# Patient Record
Sex: Female | Born: 1990 | Race: White | Hispanic: No | Marital: Married | Smoking: Never smoker
Health system: Southern US, Community
[De-identification: ages and names within clinical notes are randomized; demographics above are authoritative.]

## PROBLEM LIST (undated history)

## (undated) DIAGNOSIS — F909 Attention-deficit hyperactivity disorder, unspecified type: Secondary | ICD-10-CM

---
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
## 2011-09-24 LAB — DIFFERENTIAL
Monocytes Absolute: 0.9
Neutro Abs: 10.7 — ABNORMAL HIGH

## 2011-09-24 LAB — CBC
Hemoglobin: 14.4
MCV: 87.1
RDW: 12

## 2011-09-24 LAB — BASIC METABOLIC PANEL
BUN: 9
Chloride: 102
Creatinine, Ser: 0.6
Sodium: 141

## 2013-11-17 ENCOUNTER — Ambulatory Visit (HOSPITAL_BASED_OUTPATIENT_CLINIC_OR_DEPARTMENT_OTHER)
Admission: EM | Admit: 2013-11-17 | Discharge: 2013-11-18 | Disposition: A | Discharge status: Home / Self Care | Payer: BC Managed Care – PPO | Attending: General Surgery | Admitting: General Surgery

## 2013-11-17 ENCOUNTER — Observation Stay (HOSPITAL_COMMUNITY): Payer: BC Managed Care – PPO | Admitting: Anesthesiology

## 2013-11-17 ENCOUNTER — Encounter (HOSPITAL_COMMUNITY): Payer: BC Managed Care – PPO | Admitting: Anesthesiology

## 2013-11-17 ENCOUNTER — Emergency Department (HOSPITAL_BASED_OUTPATIENT_CLINIC_OR_DEPARTMENT_OTHER): Payer: BC Managed Care – PPO

## 2013-11-17 ENCOUNTER — Encounter (HOSPITAL_COMMUNITY)
Admission: EM | Disposition: A | Discharge status: Home / Self Care | Payer: Self-pay | Source: Home / Self Care | Attending: Emergency Medicine

## 2013-11-17 ENCOUNTER — Encounter (HOSPITAL_BASED_OUTPATIENT_CLINIC_OR_DEPARTMENT_OTHER): Payer: Self-pay | Admitting: Emergency Medicine

## 2013-11-17 DIAGNOSIS — K3580 Unspecified acute appendicitis: Secondary | ICD-10-CM | POA: Diagnosis present

## 2013-11-17 HISTORY — PX: LAPAROSCOPIC APPENDECTOMY: SHX408

## 2013-11-17 HISTORY — DX: Attention-deficit hyperactivity disorder, unspecified type: F90.9

## 2013-11-17 LAB — RPR: RPR Ser Ql: NONREACTIVE

## 2013-11-17 LAB — CBC WITH DIFFERENTIAL/PLATELET
Basophils Absolute: 0 10*3/uL (ref 0.0–0.1)
Basophils Relative: 0 % (ref 0–1)
Eosinophils Absolute: 0.2 10*3/uL (ref 0.0–0.7)
Eosinophils Relative: 2 % (ref 0–5)
HCT: 42.6 % (ref 36.0–46.0)
Lymphocytes Relative: 28 % (ref 12–46)
Lymphs Abs: 3.4 10*3/uL (ref 0.7–4.0)
MCH: 29.8 pg (ref 26.0–34.0)
Monocytes Relative: 9 % (ref 3–12)
Neutrophils Relative %: 61 % (ref 43–77)
Platelets: 290 10*3/uL (ref 150–400)
RDW: 12.2 % (ref 11.5–15.5)
WBC: 12.3 10*3/uL — ABNORMAL HIGH (ref 4.0–10.5)

## 2013-11-17 LAB — URINALYSIS, ROUTINE W REFLEX MICROSCOPIC
Bilirubin Urine: NEGATIVE
Ketones, ur: NEGATIVE mg/dL
Protein, ur: NEGATIVE mg/dL
Urobilinogen, UA: 0.2 mg/dL (ref 0.0–1.0)

## 2013-11-17 LAB — URINE MICROSCOPIC-ADD ON

## 2013-11-17 LAB — CBC
HCT: 36.4 % (ref 36.0–46.0)
MCH: 29.9 pg (ref 26.0–34.0)
MCHC: 34.6 g/dL (ref 30.0–36.0)
Platelets: 245 10*3/uL (ref 150–400)
RDW: 12.3 % (ref 11.5–15.5)

## 2013-11-17 LAB — COMPREHENSIVE METABOLIC PANEL
CO2: 24 mEq/L (ref 19–32)
Chloride: 102 mEq/L (ref 96–112)
GFR calc Af Amer: >90 mL/min (ref >90)
GFR calc non Af Amer: >90 mL/min (ref >90)
Total Bilirubin: 0.2 mg/dL — ABNORMAL LOW (ref 0.3–1.2)
Total Protein: 7.6 g/dL (ref 6.0–8.3)

## 2013-11-17 LAB — LIPASE, BLOOD: Lipase: 34 U/L (ref 11–59)

## 2013-11-17 LAB — CREATININE, SERUM
Creatinine, Ser: 0.67 mg/dL (ref 0.50–1.10)
GFR calc Af Amer: >90 mL/min (ref >90)
GFR calc non Af Amer: >90 mL/min (ref >90)

## 2013-11-17 LAB — PREGNANCY, URINE: Preg Test, Ur: NEGATIVE

## 2013-11-17 SURGERY — APPENDECTOMY, LAPAROSCOPIC
Anesthesia: General | Site: Abdomen | Wound class: Contaminated

## 2013-11-17 MED ORDER — PROPOFOL 10 MG/ML IV BOLUS
INTRAVENOUS | Status: AC
  Filled 2013-11-17: qty 20

## 2013-11-17 MED ORDER — FENTANYL CITRATE 0.05 MG/ML IJ SOLN
INTRAMUSCULAR | Status: AC
  Filled 2013-11-17: qty 2

## 2013-11-17 MED ORDER — NEOSTIGMINE METHYLSULFATE 1 MG/ML IJ SOLN
INTRAMUSCULAR | Status: AC
  Filled 2013-11-17: qty 10

## 2013-11-17 MED ORDER — SODIUM CHLORIDE 0.9 % IV SOLN
1000.0000 mL | INTRAVENOUS | Status: DC
  Administered 2013-11-17: 1000 mL via INTRAVENOUS

## 2013-11-17 MED ORDER — MIDAZOLAM HCL 2 MG/2ML IJ SOLN
INTRAMUSCULAR | Status: AC
  Filled 2013-11-17: qty 2

## 2013-11-17 MED ORDER — PROPOFOL 10 MG/ML IV BOLUS
INTRAVENOUS | Status: DC | PRN
  Administered 2013-11-17: 110 mg via INTRAVENOUS

## 2013-11-17 MED ORDER — ONDANSETRON HCL 4 MG PO TABS: 4.0000 mg | ORAL_TABLET | Freq: Four times a day (QID) | ORAL | Status: DC | PRN

## 2013-11-17 MED ORDER — SUCCINYLCHOLINE CHLORIDE 20 MG/ML IJ SOLN
INTRAMUSCULAR | Status: DC | PRN
  Administered 2013-11-17: 60 mg via INTRAVENOUS

## 2013-11-17 MED ORDER — FENTANYL CITRATE 0.05 MG/ML IJ SOLN
25.0000 ug | INTRAMUSCULAR | Status: DC | PRN
  Administered 2013-11-17 (×3): 50 ug via INTRAVENOUS

## 2013-11-17 MED ORDER — ENOXAPARIN SODIUM 40 MG/0.4ML ~~LOC~~ SOLN
40.0000 mg | SUBCUTANEOUS | Status: DC
  Administered 2013-11-18: 40 mg via SUBCUTANEOUS
  Filled 2013-11-17: qty 0.4

## 2013-11-17 MED ORDER — DEXAMETHASONE SODIUM PHOSPHATE 10 MG/ML IJ SOLN
INTRAMUSCULAR | Status: DC | PRN
  Administered 2013-11-17: 6 mg via INTRAVENOUS

## 2013-11-17 MED ORDER — 0.9 % SODIUM CHLORIDE (POUR BTL) OPTIME
TOPICAL | Status: DC | PRN
  Administered 2013-11-17: 1000 mL

## 2013-11-17 MED ORDER — ONDANSETRON HCL 4 MG/2ML IJ SOLN: 4.0000 mg | Freq: Four times a day (QID) | INTRAMUSCULAR | Status: DC | PRN

## 2013-11-17 MED ORDER — LIDOCAINE HCL (CARDIAC) 20 MG/ML IV SOLN
INTRAVENOUS | Status: AC
  Filled 2013-11-17: qty 5

## 2013-11-17 MED ORDER — GLYCOPYRROLATE 0.2 MG/ML IJ SOLN
INTRAMUSCULAR | Status: DC | PRN
  Administered 2013-11-17: .4 mg via INTRAVENOUS

## 2013-11-17 MED ORDER — SUCCINYLCHOLINE CHLORIDE 20 MG/ML IJ SOLN
INTRAMUSCULAR | Status: AC
  Filled 2013-11-17: qty 1

## 2013-11-17 MED ORDER — CISATRACURIUM BESYLATE 20 MG/10ML IV SOLN
INTRAVENOUS | Status: AC
  Filled 2013-11-17: qty 10

## 2013-11-17 MED ORDER — ONDANSETRON HCL 4 MG/2ML IJ SOLN
4.0000 mg | Freq: Once | INTRAMUSCULAR | Status: AC
  Administered 2013-11-17: 4 mg via INTRAVENOUS
  Filled 2013-11-17: qty 2

## 2013-11-17 MED ORDER — FENTANYL CITRATE 0.05 MG/ML IJ SOLN
12.5000 ug | INTRAMUSCULAR | Status: DC | PRN
  Administered 2013-11-17: 12.5 ug via INTRAVENOUS
  Filled 2013-11-17: qty 2

## 2013-11-17 MED ORDER — ENOXAPARIN SODIUM 40 MG/0.4ML ~~LOC~~ SOLN
40.0000 mg | SUBCUTANEOUS | Status: DC
  Filled 2013-11-17: qty 0.4

## 2013-11-17 MED ORDER — LACTATED RINGERS IV SOLN
INTRAVENOUS | Status: DC | PRN
  Administered 2013-11-17: 12:00:00 via INTRAVENOUS

## 2013-11-17 MED ORDER — DEXTROSE 5 % IV SOLN
1.0000 g | INTRAVENOUS | Status: DC
  Filled 2013-11-17: qty 1

## 2013-11-17 MED ORDER — FENTANYL CITRATE 0.05 MG/ML IJ SOLN
INTRAMUSCULAR | Status: DC | PRN
  Administered 2013-11-17: 100 ug via INTRAVENOUS
  Administered 2013-11-17: 50 ug via INTRAVENOUS
  Administered 2013-11-17: 100 ug via INTRAVENOUS

## 2013-11-17 MED ORDER — FENTANYL CITRATE 0.05 MG/ML IJ SOLN
INTRAMUSCULAR | Status: AC
  Filled 2013-11-17: qty 5

## 2013-11-17 MED ORDER — FENTANYL CITRATE 0.05 MG/ML IJ SOLN
25.0000 ug | INTRAMUSCULAR | Status: DC | PRN
  Administered 2013-11-17 – 2013-11-18 (×3): 25 ug via INTRAVENOUS
  Filled 2013-11-17 (×3): qty 2

## 2013-11-17 MED ORDER — CISATRACURIUM BESYLATE (PF) 10 MG/5ML IV SOLN
INTRAVENOUS | Status: DC | PRN
  Administered 2013-11-17: 4 mg via INTRAVENOUS

## 2013-11-17 MED ORDER — PIPERACILLIN-TAZOBACTAM 3.375 G IVPB
INTRAVENOUS | Status: AC
  Filled 2013-11-17: qty 50

## 2013-11-17 MED ORDER — MENTHOL 3 MG MT LOZG
1.0000 | LOZENGE | OROMUCOSAL | Status: DC | PRN
  Filled 2013-11-17: qty 9

## 2013-11-17 MED ORDER — SODIUM CHLORIDE 0.9 % IV SOLN
INTRAVENOUS | Status: DC
  Administered 2013-11-17: 10:00:00 via INTRAVENOUS

## 2013-11-17 MED ORDER — LIDOCAINE HCL (CARDIAC) 20 MG/ML IV SOLN
INTRAVENOUS | Status: DC | PRN
  Administered 2013-11-17: 60 mg via INTRAVENOUS

## 2013-11-17 MED ORDER — CEFOTETAN DISODIUM-DEXTROSE 1-3.58 GM-% IV SOLR
INTRAVENOUS | Status: AC
  Filled 2013-11-17: qty 50

## 2013-11-17 MED ORDER — NEOSTIGMINE METHYLSULFATE 1 MG/ML IJ SOLN
INTRAMUSCULAR | Status: DC | PRN
  Administered 2013-11-17: 3 mg via INTRAVENOUS

## 2013-11-17 MED ORDER — POTASSIUM CHLORIDE IN NACL 20-0.9 MEQ/L-% IV SOLN
INTRAVENOUS | Status: DC
  Administered 2013-11-17: 18:00:00 via INTRAVENOUS
  Filled 2013-11-17 (×3): qty 1000

## 2013-11-17 MED ORDER — ONDANSETRON HCL 4 MG/2ML IJ SOLN
INTRAMUSCULAR | Status: DC | PRN
  Administered 2013-11-17: 4 mg via INTRAVENOUS

## 2013-11-17 MED ORDER — HYDROMORPHONE HCL PF 1 MG/ML IJ SOLN
0.5000 mg | INTRAMUSCULAR | Status: AC | PRN
  Administered 2013-11-17 (×3): 0.5 mg via INTRAVENOUS
  Filled 2013-11-17 (×2): qty 1

## 2013-11-17 MED ORDER — LACTATED RINGERS IV SOLN: INTRAVENOUS | Status: DC

## 2013-11-17 MED ORDER — GLYCOPYRROLATE 0.2 MG/ML IJ SOLN
INTRAMUSCULAR | Status: AC
  Filled 2013-11-17: qty 2

## 2013-11-17 MED ORDER — ONDANSETRON HCL 4 MG/2ML IJ SOLN
INTRAMUSCULAR | Status: AC
  Filled 2013-11-17: qty 2

## 2013-11-17 MED ORDER — BUPIVACAINE-EPINEPHRINE PF 0.5-1:200000 % IJ SOLN
INTRAMUSCULAR | Status: DC | PRN
  Administered 2013-11-17: 15 mL

## 2013-11-17 MED ORDER — IOHEXOL 300 MG/ML  SOLN
50.0000 mL | Freq: Once | INTRAMUSCULAR | Status: AC | PRN
  Administered 2013-11-17: 50 mL via ORAL

## 2013-11-17 MED ORDER — SODIUM CHLORIDE 0.9 % IV SOLN
1000.0000 mL | Freq: Once | INTRAVENOUS | Status: AC
  Administered 2013-11-17: 1000 mL via INTRAVENOUS

## 2013-11-17 MED ORDER — IOHEXOL 300 MG/ML  SOLN
100.0000 mL | Freq: Once | INTRAMUSCULAR | Status: AC | PRN
  Administered 2013-11-17: 100 mL via INTRAVENOUS

## 2013-11-17 MED ORDER — OXYCODONE-ACETAMINOPHEN 5-325 MG PO TABS
1.0000 | ORAL_TABLET | ORAL | Status: DC | PRN
  Administered 2013-11-18 (×2): 1 via ORAL
  Filled 2013-11-17 (×2): qty 1

## 2013-11-17 MED ORDER — LACTATED RINGERS IR SOLN
Status: DC | PRN
  Administered 2013-11-17: 1000 mL

## 2013-11-17 MED ORDER — BUPIVACAINE-EPINEPHRINE PF 0.5-1:200000 % IJ SOLN
INTRAMUSCULAR | Status: AC
  Filled 2013-11-17: qty 30

## 2013-11-17 MED ORDER — DEXTROSE 5 % IV SOLN: 1.0000 g | Freq: Two times a day (BID) | INTRAVENOUS | Status: DC

## 2013-11-17 MED ORDER — MIDAZOLAM HCL 5 MG/5ML IJ SOLN
INTRAMUSCULAR | Status: DC | PRN
  Administered 2013-11-17: 2 mg via INTRAVENOUS

## 2013-11-17 MED ORDER — PIPERACILLIN-TAZOBACTAM 3.375 G IVPB
3.3750 g | Freq: Three times a day (TID) | INTRAVENOUS | Status: DC
  Filled 2013-11-17 (×2): qty 50

## 2013-11-17 MED ORDER — DEXAMETHASONE SODIUM PHOSPHATE 10 MG/ML IJ SOLN
INTRAMUSCULAR | Status: AC
  Filled 2013-11-17: qty 1

## 2013-11-17 MED ORDER — PIPERACILLIN-TAZOBACTAM 3.375 G IVPB
3.3750 g | Freq: Three times a day (TID) | INTRAVENOUS | Status: DC
  Administered 2013-11-17 – 2013-11-18 (×3): 3.375 g via INTRAVENOUS
  Filled 2013-11-17 (×5): qty 50

## 2013-11-17 SURGICAL SUPPLY — 36 items
APPLIER CLIP ROT 10 11.4 M/L (STAPLE)
CANISTER SUCTION 2500CC (MISCELLANEOUS) ×2 IMPLANT
CLIP APPLIE ROT 10 11.4 M/L (STAPLE) IMPLANT
CUTTER FLEX LINEAR 45M (STAPLE) IMPLANT
DECANTER SPIKE VIAL GLASS SM (MISCELLANEOUS) ×2 IMPLANT
DERMABOND ADVANCED (GAUZE/BANDAGES/DRESSINGS) ×1
DERMABOND ADVANCED .7 DNX12 (GAUZE/BANDAGES/DRESSINGS) ×1 IMPLANT
DEVICE TROCAR PUNCTURE CLOSURE (ENDOMECHANICALS) IMPLANT
DRAPE LAPAROSCOPIC ABDOMINAL (DRAPES) ×2 IMPLANT
ELECT REM PT RETURN 9FT ADLT (ELECTROSURGICAL) ×2
ELECTRODE REM PT RTRN 9FT ADLT (ELECTROSURGICAL) ×1 IMPLANT
ENDOLOOP SUT PDS II  0 18 (SUTURE)
ENDOLOOP SUT PDS II 0 18 (SUTURE) IMPLANT
GLOVE BIOGEL PI IND STRL 7.0 (GLOVE) ×1 IMPLANT
GLOVE BIOGEL PI INDICATOR 7.0 (GLOVE) ×1
GLOVE EUDERMIC 7 POWDERFREE (GLOVE) ×2 IMPLANT
GOWN PREVENTION PLUS LG XLONG (DISPOSABLE) ×2 IMPLANT
GOWN STRL REIN XL XLG (GOWN DISPOSABLE) ×4 IMPLANT
KIT BASIN OR (CUSTOM PROCEDURE TRAY) ×2 IMPLANT
PENCIL BUTTON HOLSTER BLD 10FT (ELECTRODE) ×2 IMPLANT
POUCH SPECIMEN RETRIEVAL 10MM (ENDOMECHANICALS) ×2 IMPLANT
RELOAD 45 VASCULAR/THIN (ENDOMECHANICALS) ×2 IMPLANT
RELOAD STAPLE TA45 3.5 REG BLU (ENDOMECHANICALS) IMPLANT
SCALPEL HARMONIC ACE (MISCELLANEOUS) IMPLANT
SET IRRIG TUBING LAPAROSCOPIC (IRRIGATION / IRRIGATOR) ×2 IMPLANT
SOLUTION ANTI FOG 6CC (MISCELLANEOUS) ×2 IMPLANT
STRIP CLOSURE SKIN 1/2X4 (GAUZE/BANDAGES/DRESSINGS) IMPLANT
SUT MNCRL AB 4-0 PS2 18 (SUTURE) ×2 IMPLANT
SUT VICRYL 0 UR6 27IN ABS (SUTURE) IMPLANT
TOWEL OR 17X26 10 PK STRL BLUE (TOWEL DISPOSABLE) ×2 IMPLANT
TRAY FOLEY CATH 14FRSI W/METER (CATHETERS) ×2 IMPLANT
TRAY LAP CHOLE (CUSTOM PROCEDURE TRAY) ×2 IMPLANT
TROCAR BLADELESS OPT 5 75 (ENDOMECHANICALS) ×2 IMPLANT
TROCAR XCEL 12X100 BLDLESS (ENDOMECHANICALS) ×4 IMPLANT
TROCAR XCEL BLUNT TIP 100MML (ENDOMECHANICALS) ×2 IMPLANT
TUBING INSUFFLATION 10FT LAP (TUBING) ×2 IMPLANT

## 2013-11-17 NOTE — ED Notes (Signed)
Bed: WA17 Expected date:  Expected time:  Means of arrival:  Comments: Transfer from [facility] appendicitis for Dr Ezzard Standing

## 2013-11-17 NOTE — Preoperative (Signed)
Beta Blockers   Reason not to administer Beta Blockers:Not Applicable 

## 2013-11-17 NOTE — Transfer of Care (Signed)
Immediate Anesthesia Transfer of Care Note  Patient: Paula Moran  Procedure(s) Performed: Procedure(s): APPENDECTOMY LAPAROSCOPIC POSSIBLE OPEN (N/A)  Patient Location: PACU  Anesthesia Type:General  Level of Consciousness: sedated  Airway & Oxygen Therapy: Patient Spontanous Breathing and Patient connected to face mask oxygen  Post-op Assessment: Report given to PACU RN and Post -op Vital signs reviewed and stable  Post vital signs: Reviewed and stable  Complications: No apparent anesthesia complications

## 2013-11-17 NOTE — Progress Notes (Signed)
General surgery attending:  I have interviewed and examined this patient this morning. I have reviewed her laboratory and radiographic evaluation and have discussed her care with Dr. Ezzard Standing.  She appears to have acute retrocecal appendicitis. There is no sign of perforation or abscess.  Plan: Admit to the hospital for IV fluids, IV antibiotics. Procedures laparoscopic appendectomy, possible open today I discussed the indications, details, techniques, and numerous risk of the surgery with the patient and her family. All her questions were answered. She understands all these issues. She agrees with this plan.   Angelia Mould. Derrell Lolling, M.D., [facility] General and Minimally invasive Surgery Breast and Colorectal Surgery Office:   [phone] Pager:   [phone]

## 2013-11-17 NOTE — Anesthesia Preprocedure Evaluation (Signed)
Anesthesia Evaluation  Patient identified by MRN, date of birth, ID band Patient awake    Reviewed: Allergy & Precautions, H&P , NPO status , Patient's Chart, lab work & pertinent test results  Airway Mallampati: II TM Distance: >3 FB Neck ROM: full    Dental no notable dental hx. (+) Teeth Intact and Dental Advisory Given   Pulmonary neg pulmonary ROS,  breath sounds clear to auscultation  Pulmonary exam normal       Cardiovascular Exercise Tolerance: Good negative cardio ROS  Rhythm:regular Rate:Normal     Neuro/Psych negative neurological ROS  negative psych ROS   GI/Hepatic negative GI ROS, Neg liver ROS,   Endo/Other  negative endocrine ROS  Renal/GU negative Renal ROS  negative genitourinary   Musculoskeletal   Abdominal   Peds  Hematology negative hematology ROS (+)   Anesthesia Other Findings   Reproductive/Obstetrics negative OB ROS                           Anesthesia Physical Anesthesia Plan  ASA: I and emergent  Anesthesia Plan: General   Post-op Pain Management:    Induction: Intravenous, Rapid sequence and Cricoid pressure planned  Airway Management Planned: Oral ETT  Additional Equipment:   Intra-op Plan:   Post-operative Plan: Extubation in OR  Informed Consent: I have reviewed the patients History and Physical, chart, labs and discussed the procedure including the risks, benefits and alternatives for the proposed anesthesia with the patient or authorized representative who has indicated his/her understanding and acceptance.   Dental Advisory Given  Plan Discussed with: CRNA and Surgeon  Anesthesia Plan Comments:         Anesthesia Quick Evaluation  

## 2013-11-17 NOTE — ED Notes (Signed)
Patient transported to CT 

## 2013-11-17 NOTE — ED Provider Notes (Signed)
Pt arrived to [facility] from [facility] for management of newly diagnosed appendicitis.  Surgeon Dr. Ezzard Standing has evaluate pt, will start pt on abx and pt currently awaits OR time and further management by Dr. Derrell Lolling.  Pt otherwise stable, in no acute distress.  Heart S1S2, lungs CTAB, abd with moderate tenderness to periumbilical/RLQ, intact distal pulses.    Fayrene Helper, PA-C 11/17/13 [phone]

## 2013-11-17 NOTE — ED Notes (Signed)
RLQ pain since Monday evening, nausea without vomiting. Denies any urinary sxs.

## 2013-11-17 NOTE — ED Notes (Signed)
Pt returned from CT °

## 2013-11-17 NOTE — ED Notes (Signed)
MD at bedside. 

## 2013-11-17 NOTE — H&P (Signed)
Re:   Paula Moran DOB:   01-Jul-1991 MRN:   [id number]  [facility] Admit note  ASSESSMENT AND PLAN: 1.  Appendicitis  I discussed with the patient the indications and risks of appendiceal surgery.  The primary risks of appendiceal surgery include, but are not limited to, bleeding, infection, bowel surgery, and open surgery.  There is also the risk that the patient may have continued symptoms after surgery.  However, the likelihood of improvement in symptoms and return to the patient's normal status is good. We discussed the typical post-operative recovery course. I tried to answer the patient's questions.  2.  ADD - on Adderall 3.  Birth control with implant in left upper arm   Chief Complaint  Patient presents with  . Abdominal Pain   REFERRING PHYSICIAN: Baltazar Apo, NP  HISTORY OF PRESENT ILLNESS: Paula Moran is a 22 y.o. (DOB: 08-08-1991)  white  female whose primary care physician is Baltazar Apo, NP and comes to the [facility] ER with appendicitis. Her mother, Ernst Bowler, and Kenlyn, Lose, are in the room.  Her grandmother had a ruptured appendix.  She developed abdominal pain to the lower right abdomen around 7 PM last night.  She has had some nausea, but no vomiting.  She has no GI history or history of abdominal surgery.  She went to [facility], where she was found to have appendicitis.  She was transferred to the [facility].  CT scan - 11/17/2013 - Acute appendicitis, without perforation or abscess. Appendix is retrocecal in location.  WBC - 12,300 - 11/17/2013   History reviewed. No pertinent past medical history.   History reviewed. No pertinent past surgical history.    Current Facility-Administered Medications  Medication Dose Route Frequency Provider Last Rate Last Dose  . 0.9 %  sodium chloride infusion  1,000 mL Intravenous Continuous Celene Kras, MD   1,000 mL at 11/17/13 0240   No current outpatient prescriptions on file.     No Known  Allergies  REVIEW OF SYSTEMS: Skin:  Acne. Infection:  No history of hepatitis or HIV.  No history of MRSA. Neurologic:  ADD. Cardiac:  No history of hypertension. No history of heart disease.  No history of prior cardiac catheterization.  No history of seeing a cardiologist. Pulmonary:  Does not smoke cigarettes.  No asthma or bronchitis.  No OSA/CPAP.  Endocrine:  No diabetes. No thyroid disease. Gastrointestinal:    See HPI.  No history of stomach disease.  No history of liver disease.  No history of gall bladder disease.  No history of pancreas disease.  No history of colon disease. Urologic:  No history of kidney stones.  No history of bladder infections. Musculoskeletal:  No history of joint or back disease. Hematologic:  No bleeding disorder.  No history of anemia.  Not anticoagulated. Psycho-social:  The patient is oriented.   The patient has no obvious psychologic or social impairment to understanding our conversation and plan.  SOCIAL and FAMILY HISTORY: Works as Runner, broadcasting/film/video at [employer]. Single, but engaged. Mother with patietn.  PHYSICAL EXAM: BP 116/80  Pulse 78  Temp(Src) 98.3 F (36.8 C) (Oral)  Resp 18  Ht 5' (1.524 m)  Wt 127 lb (57.607 kg)  BMI 24.80 kg/m2  SpO2 98%  General: WN WF who is alert and generally healthy appearing.   Has acne on face. HEENT: Normal. Pupils equal. Neck: Supple. No mass.  No thyroid mass. Lymph Nodes:  No  supraclavicular or cervical nodes. Lungs: Clear to auscultation and symmetric breath sounds. Heart:  RRR. No murmur or rub. Abdomen: Soft. No mass. No hernia. Normal bowel sounds.  No abdominal scars.  Tender RLQ. Rectal: Not done. Extremities:  Good strength and ROM  in upper and lower extremities. Neurologic:  Grossly intact to motor and sensory function. Psychiatric: Has normal mood and affect. Behavior is normal.   DATA REVIEWED: Labs and CT scan.  Ovidio Kin, MD,  [facility] [address]   [address]   [place], [place]    [address] Phone:  [phone] FAX:  [phone]

## 2013-11-17 NOTE — ED Provider Notes (Addendum)
CSN: [id number]     Arrival date & time 11/17/13  0121 History   First MD Initiated Contact with Patient 11/17/13 0136     Chief Complaint  Patient presents with  . Abdominal Pain    Patient is a 22 y.o. female presenting with abdominal pain. The history is provided by the patient.  Abdominal Pain Pain location:  RLQ Pain quality: sharp   Pain radiates to:  Does not radiate Pain severity:  Moderate Onset quality:  Gradual Duration:  6 hours Timing:  Constant Progression:  Worsening Chronicity:  New Context: not recent travel and not sick contacts   Relieved by:  Nothing Worsened by:  Movement Ineffective treatments:  None tried Associated symptoms: no anorexia, no diarrhea, no dysuria, no fever, no vaginal bleeding and no vaginal discharge     History reviewed. No pertinent past medical history. History reviewed. No pertinent past surgical history. History reviewed. No pertinent family history. History  Substance Use Topics  . Smoking status: Never Smoker   . Smokeless tobacco: Not on file  . Alcohol Use: No   OB History   Grav Para Term Preterm Abortions TAB SAB Ect Mult Living                 Review of Systems  Constitutional: Negative for fever.  Gastrointestinal: Positive for abdominal pain. Negative for diarrhea and anorexia.  Genitourinary: Negative for dysuria, vaginal bleeding and vaginal discharge.  All other systems reviewed and are negative.    Allergies  Review of patient's allergies indicates no known allergies.  Home Medications  No current outpatient prescriptions on file. BP 112/80  Pulse 89  Temp(Src) 98.1 F (36.7 C) (Oral)  Resp 16  Ht 5' (1.524 m)  Wt 127 lb (57.607 kg)  BMI 24.80 kg/m2  SpO2 98% Physical Exam  Nursing note and vitals reviewed. Constitutional: She appears well-developed and well-nourished. No distress.  HENT:  Head: Normocephalic and atraumatic.  Right Ear: External ear normal.  Left Ear: External ear normal.   Eyes: Conjunctivae are normal. Right eye exhibits no discharge. Left eye exhibits no discharge. No scleral icterus.  Neck: Neck supple. No tracheal deviation present.  Cardiovascular: Normal rate, regular rhythm and intact distal pulses.   Pulmonary/Chest: Effort normal and breath sounds normal. No stridor. No respiratory distress. She has no wheezes. She has no rales.  Abdominal: Soft. Bowel sounds are normal. She exhibits no distension. There is tenderness in the right lower quadrant. There is no rebound and no guarding. No hernia.  Genitourinary: Vagina normal and uterus normal. Pelvic exam was performed with patient supine. There is no rash, tenderness or lesion on the right labia. There is no rash, tenderness or lesion on the left labia. Cervix exhibits no motion tenderness and no discharge. Right adnexum displays no mass, no tenderness and no fullness. Left adnexum displays no mass, no tenderness and no fullness.  Musculoskeletal: She exhibits no edema and no tenderness.  Neurological: She is alert. She has normal strength. No sensory deficit. Cranial nerve deficit:  no gross defecits noted. She exhibits normal muscle tone. She displays no seizure activity. Coordination normal.  Skin: Skin is warm and dry. No rash noted.  Psychiatric: She has a normal mood and affect.    ED Course  Procedures (including critical care time) Labs Review Labs Reviewed  URINALYSIS, ROUTINE W REFLEX MICROSCOPIC - Abnormal; Notable for the following:    Hgb urine dipstick LARGE (*)    Leukocytes, UA SMALL (*)  All other components within normal limits  COMPREHENSIVE METABOLIC PANEL - Abnormal; Notable for the following:    Total Bilirubin 0.2 (*)    All other components within normal limits  CBC WITH DIFFERENTIAL - Abnormal; Notable for the following:    WBC 12.3 (*)    Monocytes Absolute 1.1 (*)    All other components within normal limits  URINE MICROSCOPIC-ADD ON - Abnormal; Notable for the  following:    Squamous Epithelial / LPF FEW (*)    Bacteria, UA FEW (*)    All other components within normal limits  GC/CHLAMYDIA PROBE AMP  URINE CULTURE  PREGNANCY, URINE  LIPASE, BLOOD  RPR   Imaging Review Ct Abdomen Pelvis W Contrast  11/17/2013   CLINICAL DATA:  Right lower quadrant pain, nausea. Possible appendicitis?  EXAM: CT ABDOMEN AND PELVIS WITH CONTRAST  TECHNIQUE: Multidetector CT imaging of the abdomen and pelvis was performed using the standard protocol following bolus administration of intravenous contrast.  CONTRAST:  50mL OMNIPAQUE IOHEXOL 300 MG/ML SOLN, OMNIPAQUE IOHEXOL 300 MG/ML SOLN  COMPARISON:  None available for comparison at time of study interpretation.  FINDINGS: Included view of the lung bases are clear. Visualized heart and pericardium are unremarkable.  The liver, spleen, gallbladder, pancreas and adrenal glands are unremarkable.  Retrocecal appendix is enlarged at 14 mm, hyperemia, and sub cm central appendicolith. The stomach, small and large bowel are normal in course and caliber without inflammatory changes. Normal appendix. No intraperitoneal free air. Trace free fluid in the pelvis is likely physiologic. Note fluid collection.  Kidneys are orthotopic, demonstrating symmetric enhancement without nephrolithiasis, hydronephrosis or renal masses. The unopacified ureters are normal in course and caliber. Delayed imaging through the kidneys demonstrates symmetric prompt excretion to the proximal urinary collecting system. Urinary bladder is partially distended and unremarkable.  Great vessels are normal in course and caliber. No lymphadenopathy by CT size criteria. Internal reproductive organs are unremarkable. The soft tissues and included osseous structures are nonsuspicious.  IMPRESSION: Acute appendicitis, without perforation or abscess. Please note, appendix is retrocecal in location.   Electronically Signed   By: Awilda Metro   On: 11/17/2013 04:00     EKG Interpretation   None      Medications  0.9 %  sodium chloride infusion (0 mLs Intravenous Stopped 11/17/13 0239)    Followed by  0.9 %  sodium chloride infusion (1,000 mLs Intravenous New Bag/Given 11/17/13 0240)  HYDROmorphone (DILAUDID) injection 0.5 mg (0.5 mg Intravenous Given 11/17/13 0156)  ondansetron (ZOFRAN) injection 4 mg (4 mg Intravenous Given 11/17/13 0156)  iohexol (OMNIPAQUE) 300 MG/ML solution 50 mL (50 mLs Oral Contrast Given 11/17/13 0333)  iohexol (OMNIPAQUE) 300 MG/ML solution 100 mL (100 mLs Intravenous Contrast Given 11/17/13 0333)  ondansetron (ZOFRAN) injection 4 mg (4 mg Intravenous Given 11/17/13 0257)    MDM   1. Acute appendicitis    Acute appendicitis confirmed on CT scan.   Will consult general surgery on call.  Plan on transfer for appendectomy.     Celene Kras, MD 11/17/13 [phone]  Discusssed with Dr Ezzard Standing .  Will transfer to [facility].  Celene Kras, MD 11/17/13 [phone]

## 2013-11-17 NOTE — Op Note (Signed)
Patient Name:           Paula Moran   Date of Surgery:        11/17/2013  Pre op Diagnosis:      Acute appendicitis  Post op Diagnosis:    Acute retrocecal appendicitis  Procedure:                 Laparoscopic appendectomy  Surgeon:                     Angelia Mould. Derrell Lolling, M.D., FACS  Assistant:                      None  Operative Indications:   This is a healthy 22 year old caucasian female who developed abdominal pain in the right lower quadrant last night. She had nausea but no vomiting. No prior GI problems. No prior history of abdominal surgery. She had a CT scan at [facility]  which showed acute appendicitis with no evidence of rupture or abscess.She was transferred and was on emergency room where she was evaluated byDr. Ovidio Kin and me.She was admitted to the hospital and started on antibiotics. She was brought to the operating room for appendectomy  Operative Findings:       The patient had acute appendicitis. The appendix was retrocecal. There was minimal purulence. There was no gangrene and no evidence of rupture.The terminal ileum, right colon, gallbladder, and liver looked normal.  Procedure in Detail:          Following induction of general endotracheal anesthesia a Foley catheter was placed and the abdomen was prepped and draped in a sterile fashion and intravenous antibodics were given. The surgical time out was performed. 0.5% Marcaine with epinephrine was used as a local infiltrative anesthetic. A vertical incision was made in the upper rim of the umbilicus. The fascia was incised in the midline and the abdominal cavity entered under direct vision. An 11 mm Hassan trocar was inserted and secured with a pursestring suture of 0 Vicryl. Pneumoperitoneum was created and video  camera was inserted. A 5 mm trocar was placed in the left abdomen and a 12 mm trocar placed in the left suprapubic area.We examined the liver gallbladder right colon terminal ileum. We  mobilized the cecum medially and saw the appendix and found we could identify the tip of the appendix and lift it up. We took down the appendiceal mesentery with the Harmonic scalpel. We did this in several steps until we had skeletonized the appendix and can clearly see the junction of the appendix with the cecum.I amputated the appendix with an Endo GIA stapling device, taking a small cuff of cecum. The appendix was placed in a specimen bag and removed. There were some loose staples and I carefully retrieved and removed. The staple line looked good. There was no bleeding. The area was irrigated a little bit. The omentum was brought down over the staple line.The trocars were removed. The pneumoperitoneum was released. Fascia at the umbilicus was closed  with 0 Vicryl sutures. After irrigating the incisions the skin was closed with subcuticular sutures of 4-0 Monocryl and Dermabond.The patient tolerated the procedure well taken to recovery in stable condition. EBL10 cc. Counts correct. Complications none.     Angelia Mould. Derrell Lolling, M.D., FACS General and Minimally Invasive Surgery Breast and Colorectal Surgery  11/17/2013 1:20 PM 54

## 2013-11-17 NOTE — Anesthesia Postprocedure Evaluation (Signed)
  Anesthesia Post-op Note  Patient: Paula Moran  Procedure(s) Performed: Procedure(s) (LRB): APPENDECTOMY LAPAROSCOPIC POSSIBLE OPEN (N/A)  Patient Location: PACU  Anesthesia Type: General  Level of Consciousness: awake and alert   Airway and Oxygen Therapy: Patient Spontanous Breathing  Post-op Pain: mild  Post-op Assessment: Post-op Vital signs reviewed, Patient's Cardiovascular Status Stable, Respiratory Function Stable, Patent Airway and No signs of Nausea or vomiting  Last Vitals:  Filed Vitals:   11/17/13 1400  BP: 112/54  Pulse: 66  Temp: 36.8 C  Resp: 11    Post-op Vital Signs: stable   Complications: No apparent anesthesia complications

## 2013-11-17 NOTE — Anesthesia Procedure Notes (Signed)
Procedure Name: Intubation Date/Time: 11/17/2013 12:24 PM Performed by: Leroy Libman L Patient Re-evaluated:Patient Re-evaluated prior to inductionOxygen Delivery Method: Circle system utilized Preoxygenation: Pre-oxygenation with 100% oxygen Intubation Type: IV induction, Cricoid Pressure applied and Rapid sequence Grade View: Grade I Tube type: Oral Tube size: 7.0 mm Number of attempts: 1 Airway Equipment and Method: Stylet Placement Confirmation: ETT inserted through vocal cords under direct vision,  breath sounds checked- equal and bilateral and positive ETCO2 Secured at: 19 cm Tube secured with: Tape Dental Injury: Teeth and Oropharynx as per pre-operative assessment

## 2013-11-18 ENCOUNTER — Encounter (HOSPITAL_COMMUNITY): Payer: Self-pay | Admitting: General Surgery

## 2013-11-18 LAB — URINE CULTURE

## 2013-11-18 MED ORDER — HYDROCODONE-ACETAMINOPHEN 5-325 MG PO TABS: 1.0000 | ORAL_TABLET | Freq: Four times a day (QID) | ORAL | Status: DC | PRN

## 2013-11-18 NOTE — Discharge Summary (Signed)
  Patient ID: ZOOEY SCHREURS [id number] 22 y.o. March 09, 1991  Admit date: 11/17/2013  Discharge date and time: 11/18/2013  Admitting Physician: Ernestene Mention  Discharge Physician: Ernestene Mention  Admission Diagnoses: Acute appendicitis [540.9]  Discharge Diagnoses: Acute appendicitis  Operations: Procedure(s): APPENDECTOMY LAPAROSCOPIC POSSIBLE OPEN  Admission Condition: good  Discharged Condition: good   Indication for Admission: This is a healthy 22 year old caucasian female who developed abdominal pain in the right lower quadrant last night. She had nausea but no vomiting. No prior GI problems. No prior history of abdominal surgery. She had a [facility] scan at [facility] which showed acute appendicitis with no evidence of rupture or abscess.She was transferred and was on emergency room where she was evaluated byDr. Ovidio Kin and me.She was admitted to the hospital and started on antibiotics. She was brought to the operating room for appendectomy  Hospital Course: The patient was started on broad-spectrum antibiotics and IV fluid resuscitation. She was taken to the operating room and underwent a laparoscopic appendectomy. She had an indicates acutely inflamed retrocecal appendix but there was no evidence of gangrene or rupture. Active appendectomy was performed uneventfully. There were no other abnormal findings. She progressed in her diet and activities and felt ready to go home the next morning. On the day of discharge her abdomen was soft. The wounds looked good. She was in no distress and had been tolerating a diet, and dilating of voiding. She was given a prescription for hydrocodone for pain. Diet and activities were discussed. She was requested to followup with me in the office in 3 weeks.  Consults: None  Significant Diagnostic Studies: radiology: CT scan: showed appendicitis, Surgical pathology, pending  Treatments: surgery: Laparoscopic  appendectomy  Disposition: Home  Patient Instructions:    Medication List         amphetamine-dextroamphetamine 15 MG tablet  Commonly known as:  ADDERALL  Take 15 mg by mouth 2 (two) times daily.     HYDROcodone-acetaminophen 5-325 MG per tablet  Commonly known as:  NORCO  Take 1-2 tablets by mouth every 6 (six) hours as needed.     IMPLANON 68 MG Impl implant  Generic drug:  etonogestrel  Inject 1 each into the skin once.        Activity: activity as tolerated Diet: regular diet Wound Care: none needed  Follow-up:  With Dr. Derrell Lolling in 3 weeks.  Signed: Angelia Mould. Derrell Lolling, M.D., FACS General and minimally invasive surgery Breast and Colorectal Surgery  11/18/2013, 6:38 AM

## 2013-11-23 ENCOUNTER — Encounter (INDEPENDENT_AMBULATORY_CARE_PROVIDER_SITE_OTHER): Payer: Self-pay

## 2013-11-23 ENCOUNTER — Telehealth (INDEPENDENT_AMBULATORY_CARE_PROVIDER_SITE_OTHER): Payer: Self-pay

## 2013-11-23 DIAGNOSIS — Z9049 Acquired absence of other specified parts of digestive tract: Secondary | ICD-10-CM

## 2013-11-23 NOTE — ED Provider Notes (Signed)
Medical screening examination/treatment/procedure(s) were performed by non-physician practitioner and as supervising physician I was immediately available for consultation/collaboration.    Celene Kras, MD 11/23/13 [phone]

## 2013-11-23 NOTE — Telephone Encounter (Signed)
Per Dr Derrell Lolling I notified the pt to try nsaids, ibuprofen 400-600mg , stretch, use heat and see her PCP for this.

## 2013-11-23 NOTE — Telephone Encounter (Signed)
Pt called stating she has been having back spasms that come on in the morning when she gets out of bed and when she is active. She states these spasms last for several minutes and are a level 10. Pt is requesting a refill of her pain medication for back spasms. Pt states her wds are doing well. Pt states she does not have a way to ck her temp. No night sweats.  No nausea. Having BMs. Voiding well without complaint. Pt states she is using heating pad on back. I advised pt since this is an unusual complaint after lap appy I will need to send this to Dr Derrell Lolling to review prior to filling pain med. Pt states she has enough until tomorrow. Pt can be reached at [phone].

## 2013-11-24 MED ORDER — HYDROCODONE-ACETAMINOPHEN 5-325 MG PO TABS: 1.0000 | ORAL_TABLET | Freq: Four times a day (QID) | ORAL | Status: DC | PRN

## 2013-11-24 NOTE — Addendum Note (Signed)
Addended byIvory Broad on: 11/24/2013 05:21 PM   Modules accepted: Orders

## 2013-11-24 NOTE — Telephone Encounter (Signed)
I called and notified the pt that Dr Derrell Lolling authorized #[id number] Vicodin.  It will be up front for her to pick up.  She will have her mom pick it up tomorrow.  I let her know a note is up front for her to pick up as well for being out of work for 2 weeks.

## 2013-11-24 NOTE — Telephone Encounter (Signed)
FYIArline Asp typed a note this am stating the pt can return to work on 12/9 which is 2 weeks po.

## 2013-11-24 NOTE — Telephone Encounter (Signed)
Mother called in this morning asking about a refill of medication for patient.  She states that yes patient has had some back spasm's but she believes that is due to patient trying to over due things.  She has instructed the patient that she needs to rest more and not continue to run.  Mother states that patient's real source of pain has been the incision site at her belly button.  Mother states that patient tried Ibuprofen and other recommendations last night but didn't get any relief.  Mother states she thinks that just a couple more days of the pain medication would get the patient through the last of the pain.  Mother also asking when it would be appropriate for the patient to return to school and work.  Patient's surgery was 11/17/13, lap appy.  Explained that I would send another message to Dr. Derrell Lolling to try to clarify the reason for the pain medication for his review then we will let her know.  Mothers # is [phone].

## 2013-12-02 ENCOUNTER — Encounter (INDEPENDENT_AMBULATORY_CARE_PROVIDER_SITE_OTHER): Payer: Self-pay

## 2013-12-08 ENCOUNTER — Telehealth (INDEPENDENT_AMBULATORY_CARE_PROVIDER_SITE_OTHER): Payer: Self-pay

## 2013-12-08 NOTE — Telephone Encounter (Signed)
Appt made with patient.  

## 2013-12-08 NOTE — Telephone Encounter (Signed)
Message copied by Ivory Broad on Tue Dec 08, 2013  1:47 PM ------      Message from: Mervin Kung      Created: Tue Dec 08, 2013  8:37 AM      Contact: [phone]       [place] Dec,      Pt needs a follow up appt SX was around thanksgiving. My first avail is 01/06 pt states she can be here asap if we can work her in she works down the street.       sonya ------

## 2013-12-08 NOTE — Telephone Encounter (Signed)
Left message for pt to call me.  I want to ask her to go ahead and come in today and we will work her in.  She had an appendectomy.

## 2013-12-09 ENCOUNTER — Encounter (INDEPENDENT_AMBULATORY_CARE_PROVIDER_SITE_OTHER): Payer: Self-pay | Admitting: General Surgery

## 2013-12-09 ENCOUNTER — Ambulatory Visit (INDEPENDENT_AMBULATORY_CARE_PROVIDER_SITE_OTHER): Payer: BC Managed Care – PPO | Admitting: General Surgery

## 2013-12-09 VITALS — BP 116/72 | HR 72 | Temp 98.1°F | Resp 16 | Ht 60.0 in | Wt 130.4 lb

## 2013-12-09 DIAGNOSIS — K3580 Unspecified acute appendicitis: Secondary | ICD-10-CM

## 2013-12-09 NOTE — Patient Instructions (Signed)
You have recovered from your appendectomy without any obvious surgical complications.  You have been given a copy of your pathology report, which shows acute appendicitis, but no tumors.  You may resume normal physical activities without restriction  Return to see Dr. Derrell Lolling if necessary.

## 2013-12-09 NOTE — Progress Notes (Signed)
Patient ID: Paula Moran, female   DOB: Jun 04, 1991, 22 y.o.   MRN: [id number] History: This young woman underwent laparoscopic appendectomy appendectomy for histologically confirmed acute suppurative appendicitis 11/17/2013. She is doing well. Appetite normal. Normal well-formed bowel movements. Minimal pain. No wound problems. 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  Exam: Patient looks well. No distress Abdomen soft. Nontender. All 3 trocar sites healing uneventfully.  Assessment: Acute suppurative appendicitis, uneventful recovery following laparoscopic appendectomy  Plan: She was given a copy of her pathology report. Okay to resume normal physical activities without restriction Return to see me as needed.   Angelia Mould. Derrell Lolling, M.D., [facility] General and Minimally invasive Surgery Breast and Colorectal Surgery Office:   [phone] Pager:   [phone]

## 2014-08-09 IMAGING — CT CT ABD-PELV W/ CM
2 of 4 series · 16 of 46 positions shown, 18 images · IV contrast (APPLIED)
Comparison: None available for comparison at time of study
interpretation.

CLINICAL DATA: Right lower quadrant pain, nausea. Possible
appendicitis?

EXAM:
CT ABDOMEN AND PELVIS WITH CONTRAST
TECHNIQUE: Multidetector CT imaging of the abdomen and pelvis was performed
using the standard protocol following bolus administration of
intravenous contrast.
CONTRAST:  50mL OMNIPAQUE IOHEXOL 300 MG/ML SOLN, 100mL OMNIPAQUE
IOHEXOL 300 MG/ML SOLN

[Series 2: abd/pelvis 5.0 b31f · axial · 0.68mm/px · z∈[-615,-240]mm · 13 of 83 slices shown, 15 images]
[im 4/83  soft-tissue]
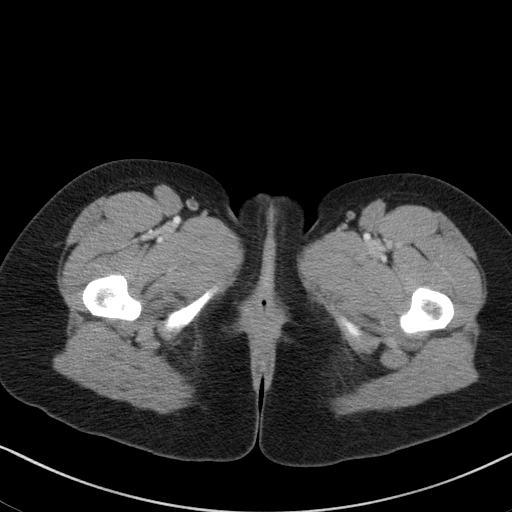
[im 4/83  bone]
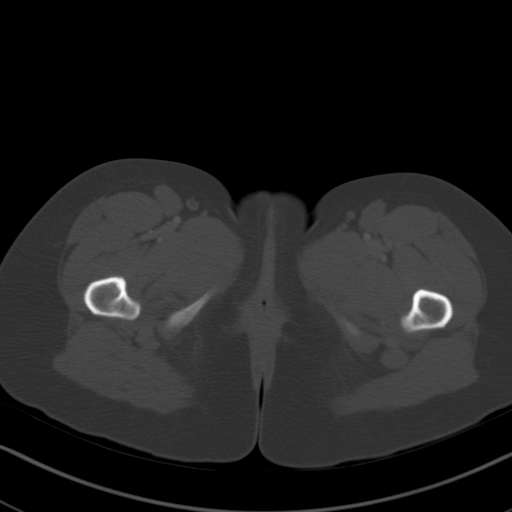
[im 11/83  soft-tissue]
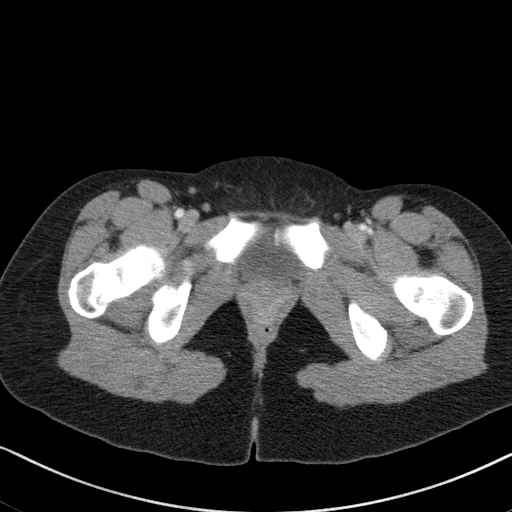
[im 18/83  soft-tissue]
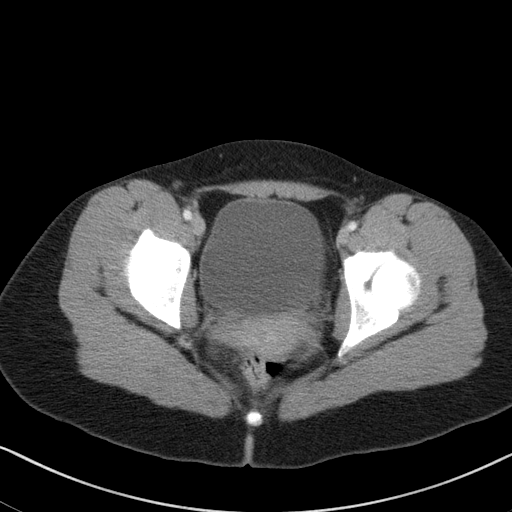
[im 24/83  soft-tissue]
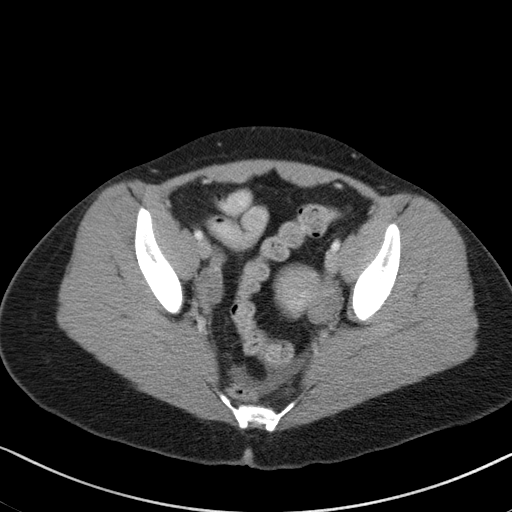
[im 28/83  soft-tissue]
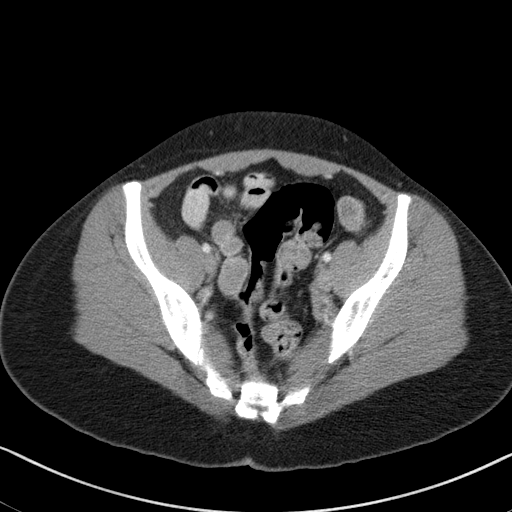
[im 35/83  soft-tissue]
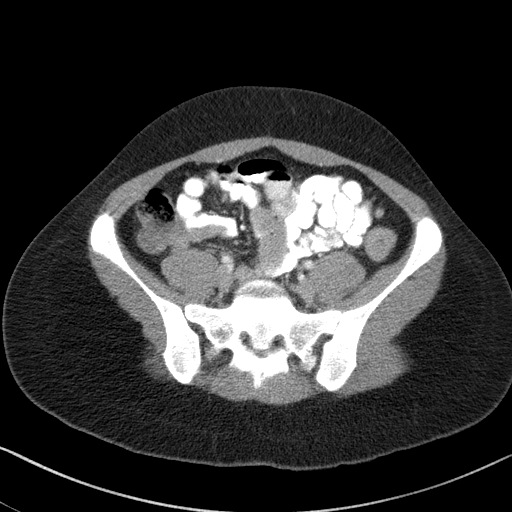
[im 42/83  soft-tissue]
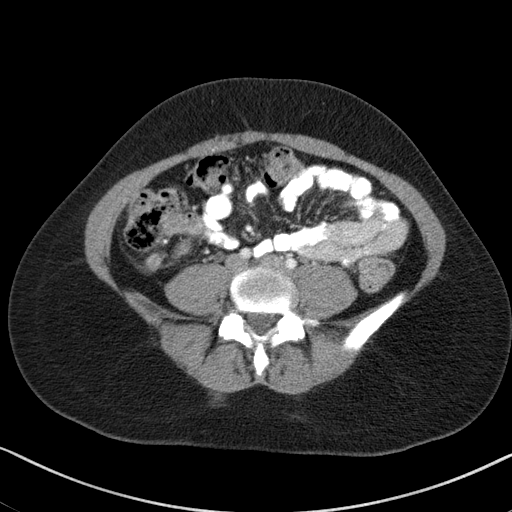
[im 48/83  soft-tissue]
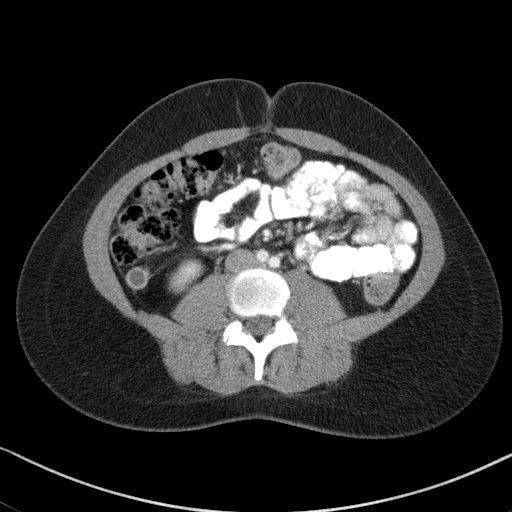
[im 55/83  soft-tissue]
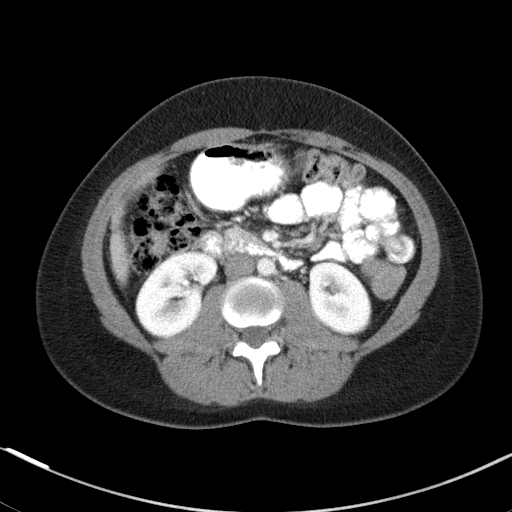
[im 55/83  bone]
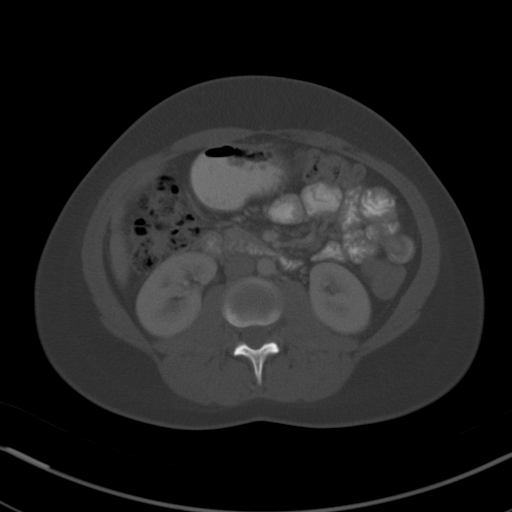
[im 59/83  soft-tissue]
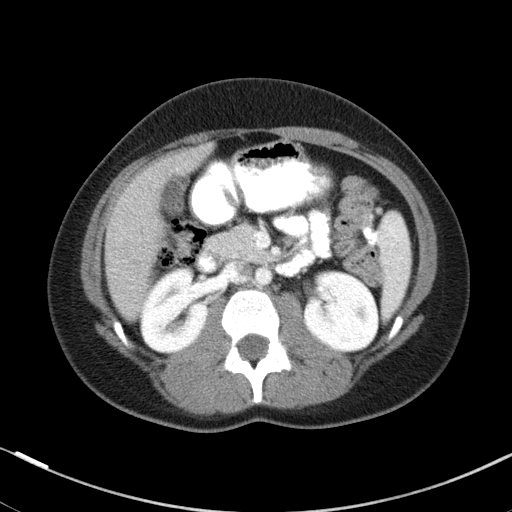
[im 65/83  soft-tissue]
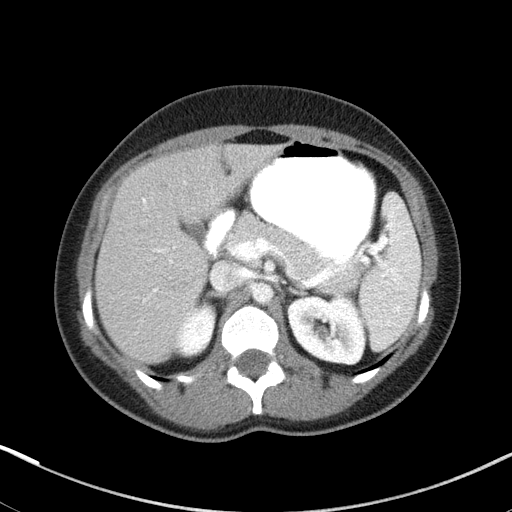
[im 72/83  soft-tissue]
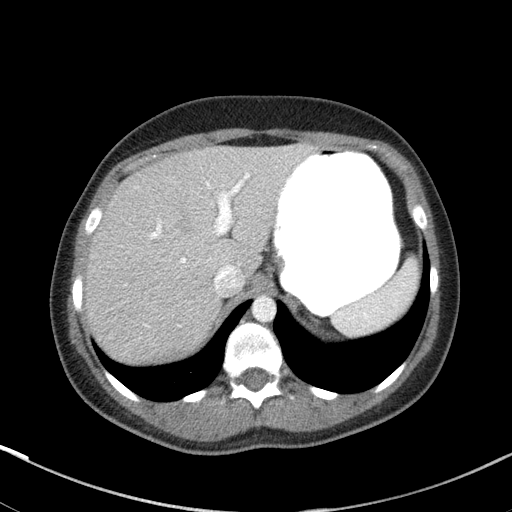
[im 79/83  soft-tissue]
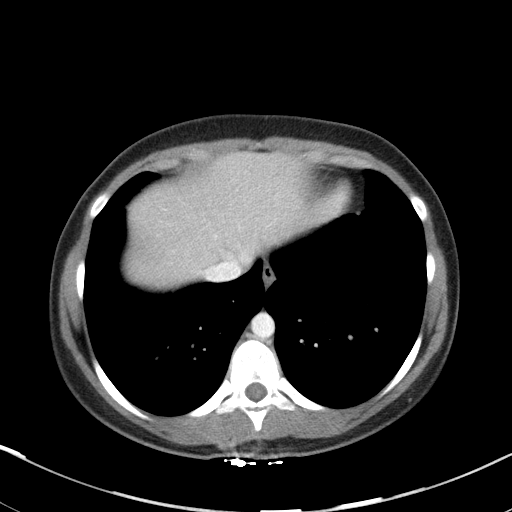

[Series 5: abd/pelvis 3.0 coronal · coronal · 0.66mm/px · 3 of 83 slices shown]
[im 28/83  soft-tissue]
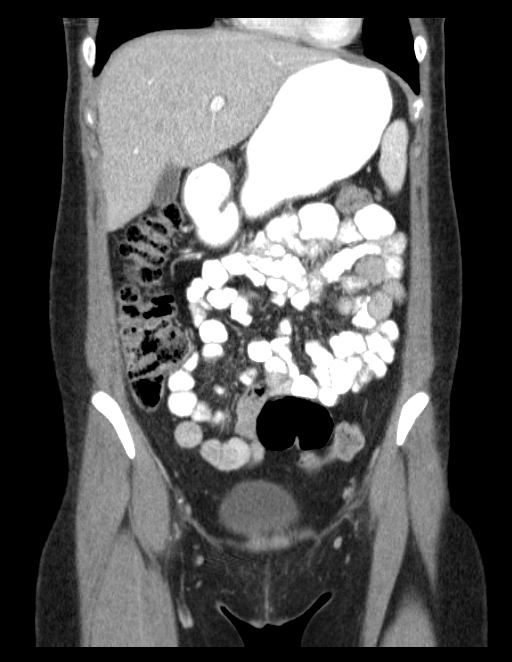
[im 37/83  soft-tissue]
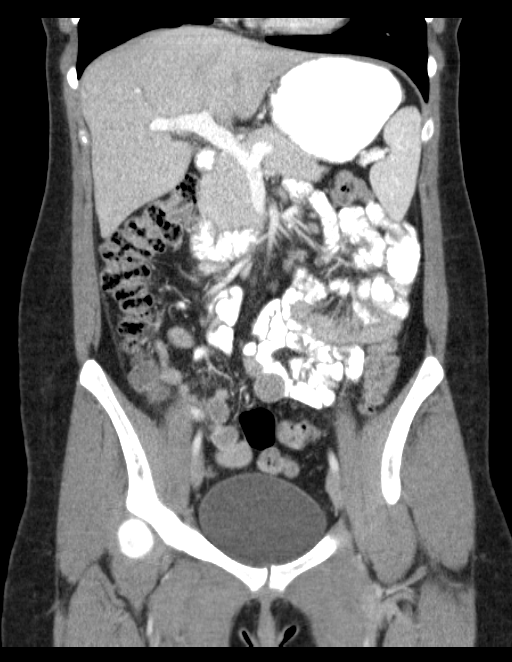
[im 46/83  soft-tissue]
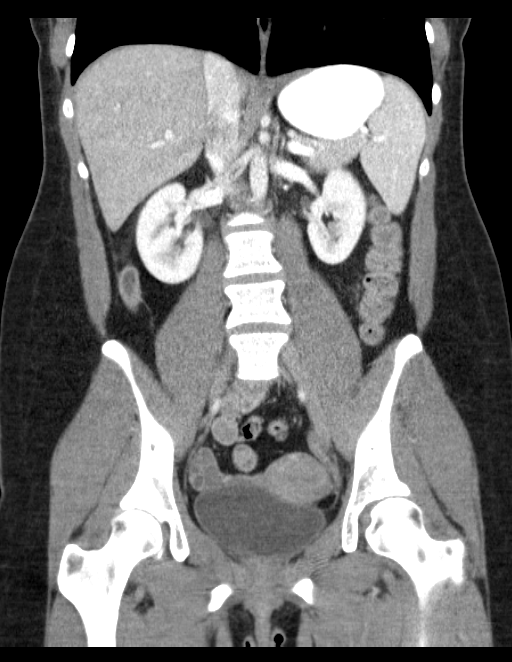

[16 of 46 positions shown; findings below may reference images not displayed]

FINDINGS: Included view of the lung bases are clear. Visualized heart and
pericardium are unremarkable.

The liver, spleen, gallbladder, pancreas and adrenal glands are
unremarkable.

Retrocecal appendix is enlarged at 14 mm, hyperemia, and sub cm
central appendicolith. The stomach, small and large bowel are normal
in course and caliber without inflammatory changes. Normal appendix.
No intraperitoneal free air. Trace free fluid in the pelvis is
likely physiologic. Note fluid collection.

Kidneys are orthotopic, demonstrating symmetric enhancement without
nephrolithiasis, hydronephrosis or renal masses. The unopacified
ureters are normal in course and caliber. Delayed imaging through
the kidneys demonstrates symmetric prompt excretion to the proximal
urinary collecting system. Urinary bladder is partially distended
and unremarkable.

Great vessels are normal in course and caliber. No lymphadenopathy
by CT size criteria. Internal reproductive organs are unremarkable.
The soft tissues and included osseous structures are nonsuspicious.
IMPRESSION: Acute appendicitis, without perforation or abscess. Please note,
appendix is retrocecal in location.

  By: Zeinab Tiger

## 2021-04-24 ENCOUNTER — Other Ambulatory Visit: Payer: Self-pay

## 2021-04-24 ENCOUNTER — Encounter (HOSPITAL_BASED_OUTPATIENT_CLINIC_OR_DEPARTMENT_OTHER): Payer: Self-pay | Admitting: *Deleted

## 2021-04-24 ENCOUNTER — Other Ambulatory Visit (HOSPITAL_BASED_OUTPATIENT_CLINIC_OR_DEPARTMENT_OTHER): Payer: Self-pay

## 2021-04-24 ENCOUNTER — Emergency Department (HOSPITAL_BASED_OUTPATIENT_CLINIC_OR_DEPARTMENT_OTHER): Payer: BC Managed Care – PPO

## 2021-04-24 ENCOUNTER — Emergency Department (HOSPITAL_BASED_OUTPATIENT_CLINIC_OR_DEPARTMENT_OTHER)
Admission: EM | Admit: 2021-04-24 | Discharge: 2021-04-24 | Disposition: A | Discharge status: Home / Self Care | Payer: BC Managed Care – PPO | Attending: Emergency Medicine | Admitting: Emergency Medicine

## 2021-04-24 DIAGNOSIS — R0789 Other chest pain: Secondary | ICD-10-CM | POA: Diagnosis not present

## 2021-04-24 DIAGNOSIS — R112 Nausea with vomiting, unspecified: Secondary | ICD-10-CM | POA: Diagnosis present

## 2021-04-24 DIAGNOSIS — R197 Diarrhea, unspecified: Secondary | ICD-10-CM | POA: Insufficient documentation

## 2021-04-24 DIAGNOSIS — R000 Tachycardia, unspecified: Secondary | ICD-10-CM | POA: Insufficient documentation

## 2021-04-24 LAB — COMPREHENSIVE METABOLIC PANEL
ALT: 26 U/L (ref 0–44)
AST: 24 U/L (ref 15–41)
Albumin: 3.6 g/dL (ref 3.5–5.0)
Alkaline Phosphatase: 48 U/L (ref 38–126)
Anion gap: 9 (ref 5–15)
BUN: 8 mg/dL (ref 6–20)
CO2: 23 mmol/L (ref 22–32)
Calcium: 8.2 mg/dL — ABNORMAL LOW (ref 8.9–10.3)
Chloride: 100 mmol/L (ref 98–111)
Creatinine, Ser: 0.6 mg/dL (ref 0.44–1.00)
GFR, Estimated: >60 mL/min (ref >60)
Glucose, Bld: 111 mg/dL — ABNORMAL HIGH (ref 70–99)
Potassium: 3.3 mmol/L — ABNORMAL LOW (ref 3.5–5.1)
Sodium: 132 mmol/L — ABNORMAL LOW (ref 135–145)
Total Bilirubin: 0.5 mg/dL (ref 0.3–1.2)
Total Protein: 6.7 g/dL (ref 6.5–8.1)

## 2021-04-24 LAB — URINALYSIS, ROUTINE W REFLEX MICROSCOPIC
Bilirubin Urine: NEGATIVE
Glucose, UA: NEGATIVE mg/dL
Hgb urine dipstick: NEGATIVE
Ketones, ur: NEGATIVE mg/dL
Leukocytes,Ua: NEGATIVE
Nitrite: NEGATIVE
Protein, ur: NEGATIVE mg/dL
Specific Gravity, Urine: <1.005 — ABNORMAL LOW (ref 1.005–1.030)
pH: 6.5 (ref 5.0–8.0)

## 2021-04-24 LAB — TROPONIN I (HIGH SENSITIVITY)
Troponin I (High Sensitivity): <2 ng/L (ref <18)
Troponin I (High Sensitivity): <2 ng/L (ref <18)

## 2021-04-24 LAB — CBC
HCT: 41.1 % (ref 36.0–46.0)
Hemoglobin: 14.4 g/dL (ref 12.0–15.0)
MCH: 30.6 pg (ref 26.0–34.0)
MCHC: 35 g/dL (ref 30.0–36.0)
MCV: 87.3 fL (ref 80.0–100.0)
Platelets: 213 10*3/uL (ref 150–400)
RBC: 4.71 MIL/uL (ref 3.87–5.11)
RDW: 12.8 % (ref 11.5–15.5)
WBC: 8.3 10*3/uL (ref 4.0–10.5)
nRBC: 0 % (ref 0.0–0.2)

## 2021-04-24 LAB — PREGNANCY, URINE: Preg Test, Ur: NEGATIVE

## 2021-04-24 LAB — LIPASE, BLOOD: Lipase: 23 U/L (ref 11–51)

## 2021-04-24 MED ORDER — ALUM & MAG HYDROXIDE-SIMETH 200-200-20 MG/5ML PO SUSP
30.0000 mL | Freq: Once | ORAL | Status: AC
  Administered 2021-04-24: 30 mL via ORAL
  Filled 2021-04-24: qty 30

## 2021-04-24 MED ORDER — FAMOTIDINE 20 MG PO TABS
40.0000 mg | ORAL_TABLET | Freq: Once | ORAL | Status: AC
  Administered 2021-04-24: 40 mg via ORAL
  Filled 2021-04-24: qty 2

## 2021-04-24 MED ORDER — ONDANSETRON HCL 4 MG/2ML IJ SOLN
4.0000 mg | Freq: Once | INTRAMUSCULAR | Status: AC
  Administered 2021-04-24: 4 mg via INTRAVENOUS
  Filled 2021-04-24: qty 2

## 2021-04-24 MED ORDER — ONDANSETRON 4 MG PO TBDP
4.0000 mg | ORAL_TABLET | Freq: Three times a day (TID) | ORAL | 0 refills | Status: DC | PRN
  Filled 2021-04-24: qty 6, 2d supply, fill #0

## 2021-04-24 MED ORDER — LACTATED RINGERS IV BOLUS
1000.0000 mL | Freq: Once | INTRAVENOUS | Status: AC
  Administered 2021-04-24: 1000 mL via INTRAVENOUS

## 2021-04-24 MED ORDER — FAMOTIDINE 20 MG PO TABS
20.0000 mg | ORAL_TABLET | Freq: Two times a day (BID) | ORAL | 0 refills | Status: AC
  Filled 2021-04-24: qty 14, 7d supply, fill #0

## 2021-04-24 NOTE — ED Notes (Signed)
Patient transported to X-ray 

## 2021-04-24 NOTE — ED Notes (Signed)
Pt states at triage unable to go to the restroom for urine at this time.

## 2021-04-24 NOTE — ED Notes (Signed)
Patient given popsicle and ginger ale

## 2021-04-24 NOTE — ED Triage Notes (Signed)
Diarrhea and vomiting since yesterday. Epigastric pain for 2 hours. Feels like an elephant is on her chest. EKG done at triage.

## 2021-04-24 NOTE — ED Provider Notes (Signed)
[facility] EMERGENCY DEPARTMENT Provider Note   CSN: [id number] Arrival date & time: 04/24/21  1039     History Chief Complaint  Patient presents with  . Emesis  . Diarrhea    Paula Moran is a 30 y.o. female.  41-year-old female with history of ADHD who presents with vomiting and diarrhea.  Patient began having nonbloody, nonbilious emesis associated with nonbloody diarrhea yesterday morning.  Symptoms have persisted into today.  Last episode of vomiting was last night and last episode of diarrhea was this morning.  A few hours ago prior to arrival, she began having central chest pressure that felt like an elephant sitting on her chest.  Pain has been constant since it began.  She denies any abdominal pain, urinary symptoms, fevers, or URI symptoms.  No sick contacts, unusual foods, or recent travel.  She is taking Tylenol and Pepto-Bismol for her symptoms.  The history is provided by the patient.  Emesis Associated symptoms: diarrhea   Diarrhea Associated symptoms: vomiting        Past Medical History:  Diagnosis Date  . ADHD (attention deficit hyperactivity disorder)     Patient Active Problem List   Diagnosis Date Noted  . Acute appendicitis 11/17/2013    Past Surgical History:  Procedure Laterality Date  . LAPAROSCOPIC APPENDECTOMY N/A 11/17/2013   Procedure: APPENDECTOMY LAPAROSCOPIC POSSIBLE OPEN;  Surgeon: Ernestene Mention, MD;  Location: [facility] ORS;  Service: General;  Laterality: N/A;     OB History   No obstetric history on file.     Family History  Problem Relation Age of Onset  . Cancer Maternal Grandmother        lung    Social History   Tobacco Use  . Smoking status: Never Smoker  . Smokeless tobacco: Never Used  Substance Use Topics  . Alcohol use: No  . Drug use: No    Home Medications Prior to Admission medications   Medication Sig Start Date End Date Taking? Authorizing Provider  famotidine (PEPCID) 20 MG tablet Take 1  tablet (20 mg total) by mouth 2 (two) times daily for 7 days. 04/24/21 05/01/21 Yes Olander Friedl, Ambrose Finland, MD  ondansetron (ZOFRAN ODT) 4 MG disintegrating tablet Take 1 tablet (4 mg total) by mouth every 8 (eight) hours as needed for nausea or vomiting. 04/24/21  Yes Ananda Caya, Ambrose Finland, MD  amphetamine-dextroamphetamine (ADDERALL) 15 MG tablet Take 15 mg by mouth 2 (two) times daily.    [provider]  etonogestrel (NEXPLANON) 68 MG IMPL implant Inject 1 each into the skin once.    [provider]    Allergies    Patient has no known allergies.  Review of Systems   Review of Systems  Gastrointestinal: Positive for diarrhea and vomiting.   All other systems reviewed and are negative except that which was mentioned in HPI  Physical Exam Updated Vital Signs BP 111/76   Pulse 76   Temp 98.1 F (36.7 C)   Resp (!) 21   Ht 5' (1.524 m)   Wt 71.2 kg   LMP 04/09/2021   SpO2 98%   BMI 30.66 kg/m   Physical Exam Constitutional:      General: She is not in acute distress.    Appearance: Normal appearance.  HENT:     Head: Normocephalic and atraumatic.  Eyes:     Conjunctiva/sclera: Conjunctivae normal.  Cardiovascular:     Rate and Rhythm: Normal rate and regular rhythm.  Heart sounds: Normal heart sounds. No murmur heard.   Pulmonary:     Effort: Pulmonary effort is normal.     Breath sounds: Normal breath sounds.  Abdominal:     General: Abdomen is flat. Bowel sounds are increased. There is no distension.     Palpations: Abdomen is soft.     Tenderness: There is no abdominal tenderness.  Musculoskeletal:     Right lower leg: No edema.     Left lower leg: No edema.  Skin:    General: Skin is warm and dry.  Neurological:     Mental Status: She is alert and oriented to person, place, and time.     Comments: fluent  Psychiatric:        Mood and Affect: Mood normal.        Behavior: Behavior normal.     ED Results / Procedures / Treatments    Labs (all labs ordered are listed, but only abnormal results are displayed) Labs Reviewed  COMPREHENSIVE METABOLIC PANEL - Abnormal; Notable for the following components:      Result Value   Sodium 132 (*)    Potassium 3.3 (*)    Glucose, Bld 111 (*)    Calcium 8.2 (*)    All other components within normal limits  URINALYSIS, ROUTINE W REFLEX MICROSCOPIC - Abnormal; Notable for the following components:   Specific Gravity, Urine <1.005 (*)    All other components within normal limits  LIPASE, BLOOD  CBC  PREGNANCY, URINE  TROPONIN I (HIGH SENSITIVITY)  TROPONIN I (HIGH SENSITIVITY)    EKG EKG Interpretation  Date/Time:  Monday Apr 24 2021 11:07:54 EDT Ventricular Rate:  106 PR Interval:  126 QRS Duration: 84 QT Interval:  348 QTC Calculation: 462 R Axis:   56 Text Interpretation: Sinus tachycardia Otherwise normal ECG No previous ECGs available Confirmed by Frederick Peers [id number]) on 04/24/2021 11:34:10 AM   Radiology DG Chest 2 View  Result Date: 04/24/2021 CLINICAL DATA:  central chest pressure during vomiting and diarrhea illness EXAM: CHEST - 2 VIEW COMPARISON:  None. FINDINGS: Cardiomediastinal silhouette is within normal limits. There is no focal airspace disease. There is no pleural effusion or pneumothorax. There is no acute osseous abnormality. IMPRESSION: No evidence of acute cardiopulmonary disease. Electronically Signed   By: Caprice Renshaw   On: 04/24/2021 12:01    Procedures Procedures   Medications Ordered in ED Medications  lactated ringers bolus 1,000 mL (0 mLs Intravenous Stopped 04/24/21 1312)  ondansetron (ZOFRAN) injection 4 mg (4 mg Intravenous Given 04/24/21 1155)  alum & mag hydroxide-simeth (MAALOX/MYLANTA) 200-200-20 MG/5ML suspension 30 mL (30 mLs Oral Given 04/24/21 1154)  famotidine (PEPCID) tablet 40 mg (40 mg Oral Given 04/24/21 1331)    ED Course  I have reviewed the triage vital signs and the nursing notes.  Pertinent labs & imaging results  that were available during my care of the patient were reviewed by me and considered in my medical decision making (see chart for details).    MDM Rules/Calculators/A&P                          Well-appearing and pleasant on exam with normal vital signs.  Hyperactive bowel sounds.  No focal abdominal tenderness no complaints of abdominal pain.  EKG without acute ischemic changes.  Patient has no risk factors for PE and symptoms are not suggestive of PE.   Highly doubt ACS as pain only began during  V/D illness and she has no risk factors for early heart disease.  Chest x-ray is clear and she is well-appearing, highly doubt Boerhaave's.  Suspect she may be having reflux or burning esophageal pain from all the vomiting.  Lab work is reassuring with normal LFTs and lipase, no evidence of severe dehydration, negative serial troponins.  Patient well-appearing on reassessment, nausea is improved and she has been able to tolerate liquids.  I have counseled on supportive measures for diarrhea including probiotics, Zofran as needed, aggressive hydration, and slow advancement of diet as tolerated.  Instructed her to follow-up with PCP in 1 week if diarrhea not improved for stool studies.  I have extensively reviewed return precautions with her including intractable vomiting, abdominal pain, worsening chest pain, or fever.  Patient voiced understanding. Final Clinical Impression(s) / ED Diagnoses Final diagnoses:  Nausea vomiting and diarrhea  Atypical chest pain    Rx / DC Orders ED Discharge Orders         Ordered    ondansetron (ZOFRAN ODT) 4 MG disintegrating tablet  Every 8 hours PRN        04/24/21 1444    famotidine (PEPCID) 20 MG tablet  2 times daily        04/24/21 1444           Lashawnna Lambrecht, Ambrose Finland, MD 04/24/21 1447

## 2022-01-14 IMAGING — CR DG CHEST 2V
2 series · 2 of 2 positions shown · non-contrast
Comparison: None.

CLINICAL DATA: central chest pressure during vomiting and diarrhea
illness

EXAM:
CHEST - 2 VIEW

[w chest pa]
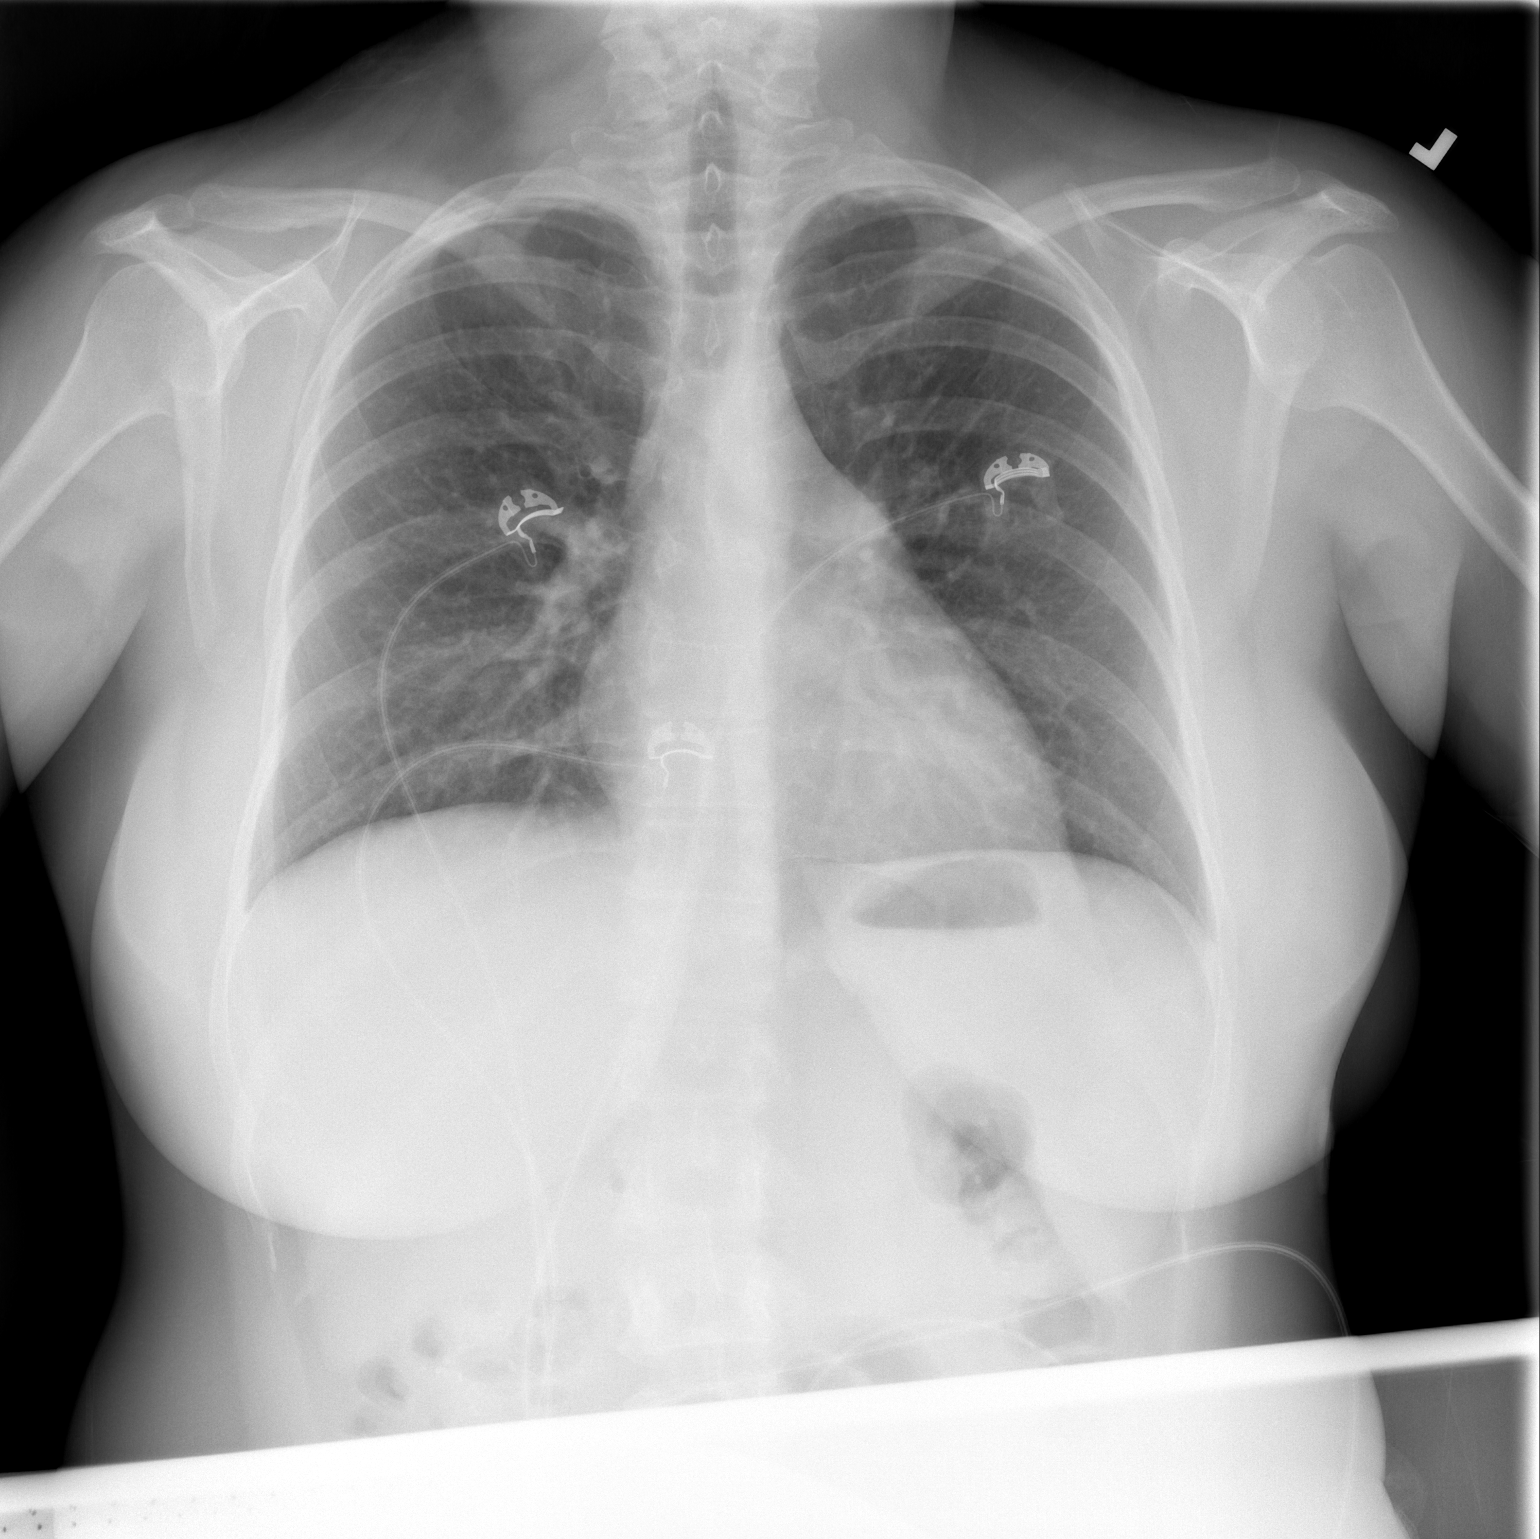

[w chest lat]
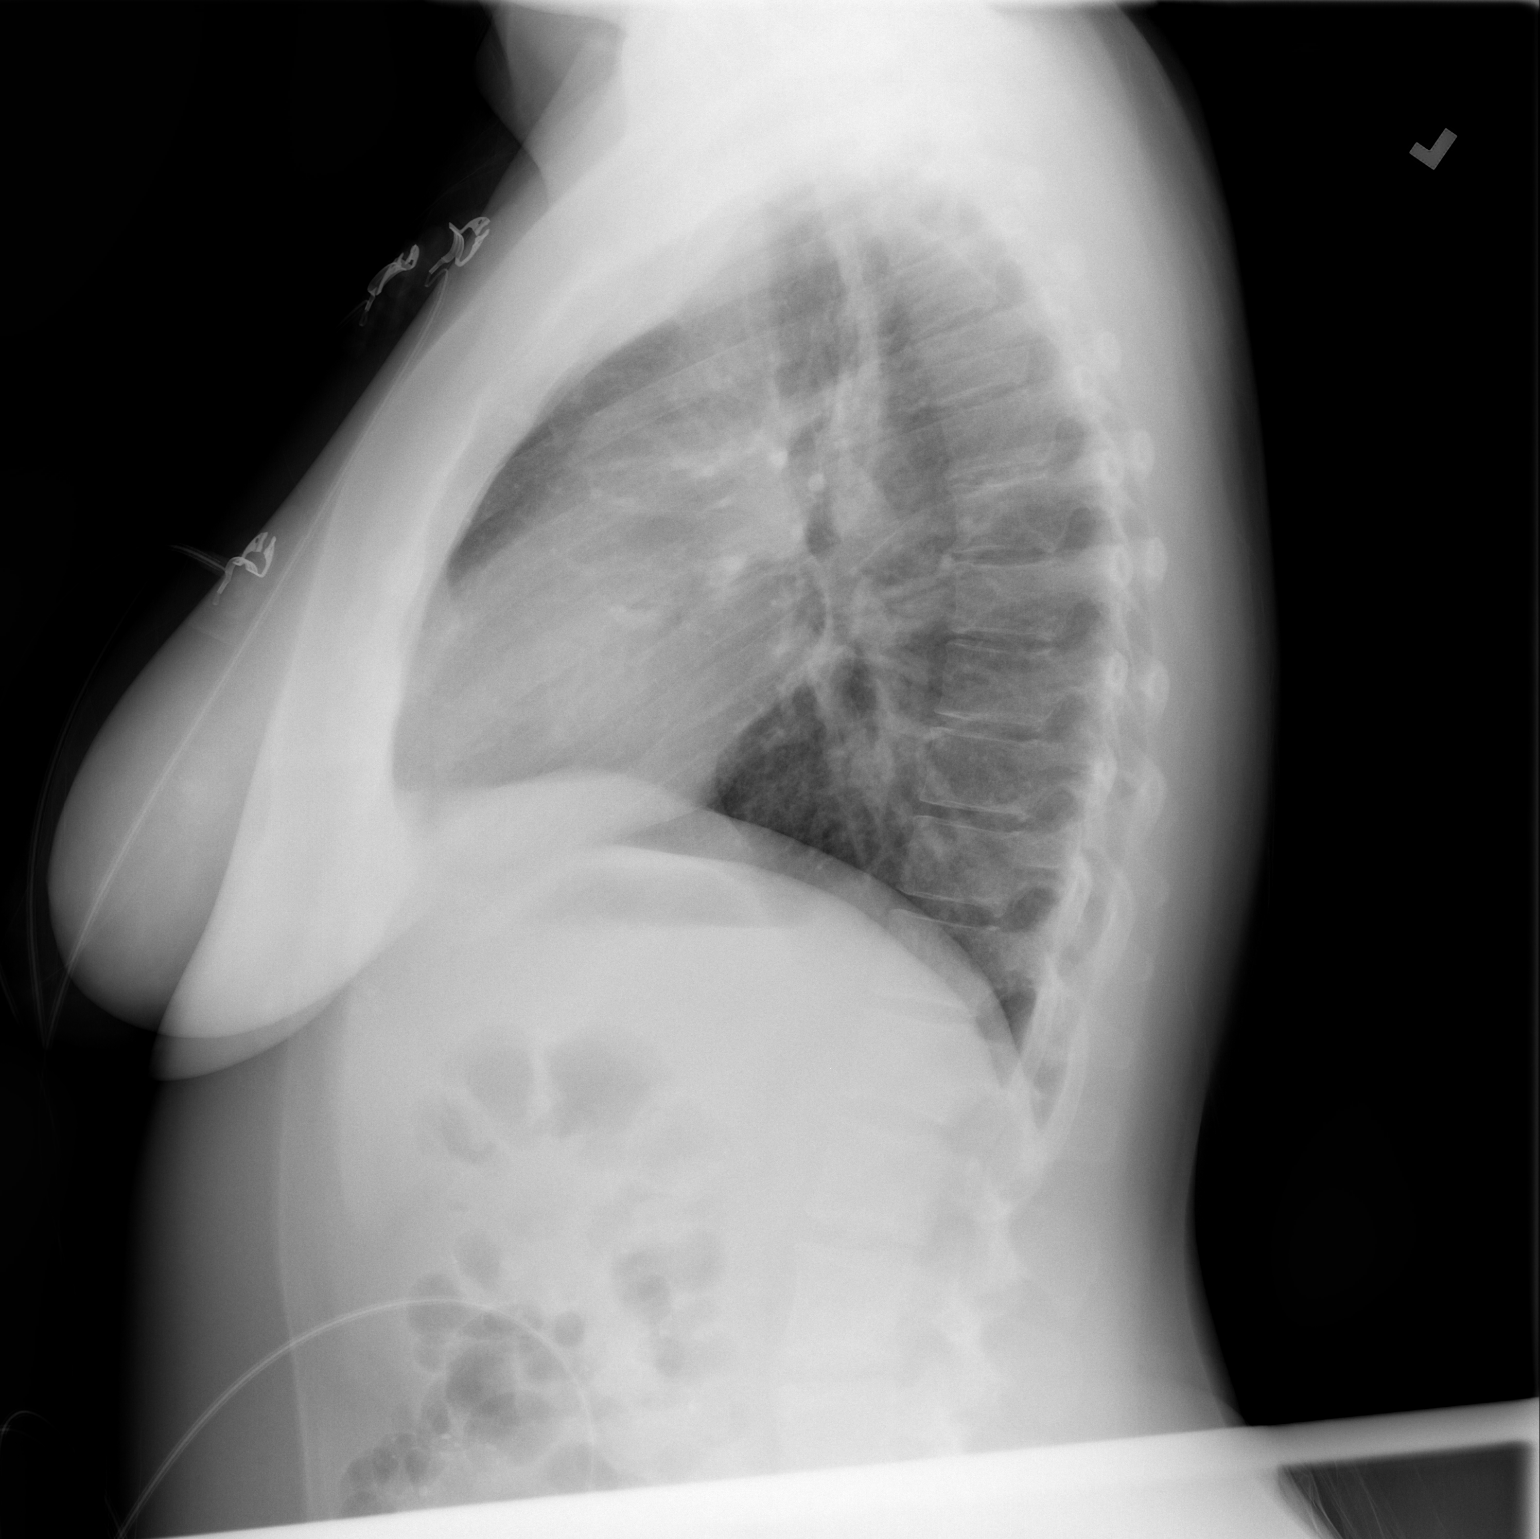

[2 of 2 positions shown; findings below may reference images not displayed]

FINDINGS: Cardiomediastinal silhouette is within normal limits. There is no
focal airspace disease. There is no pleural effusion or
pneumothorax. There is no acute osseous abnormality.
IMPRESSION: No evidence of acute cardiopulmonary disease.

## 2023-03-18 ENCOUNTER — Emergency Department (HOSPITAL_BASED_OUTPATIENT_CLINIC_OR_DEPARTMENT_OTHER): Payer: BC Managed Care – PPO

## 2023-03-18 ENCOUNTER — Encounter (HOSPITAL_BASED_OUTPATIENT_CLINIC_OR_DEPARTMENT_OTHER): Payer: Self-pay

## 2023-03-18 ENCOUNTER — Other Ambulatory Visit: Payer: Self-pay

## 2023-03-18 ENCOUNTER — Emergency Department (HOSPITAL_BASED_OUTPATIENT_CLINIC_OR_DEPARTMENT_OTHER)
Admission: EM | Admit: 2023-03-18 | Discharge: 2023-03-18 | Disposition: A | Discharge status: Home / Self Care | Payer: BC Managed Care – PPO | Attending: Emergency Medicine | Admitting: Emergency Medicine

## 2023-03-18 DIAGNOSIS — J168 Pneumonia due to other specified infectious organisms: Secondary | ICD-10-CM | POA: Diagnosis not present

## 2023-03-18 DIAGNOSIS — Z1152 Encounter for screening for COVID-19: Secondary | ICD-10-CM | POA: Insufficient documentation

## 2023-03-18 DIAGNOSIS — R509 Fever, unspecified: Secondary | ICD-10-CM | POA: Diagnosis present

## 2023-03-18 DIAGNOSIS — J189 Pneumonia, unspecified organism: Secondary | ICD-10-CM

## 2023-03-18 LAB — URINALYSIS, ROUTINE W REFLEX MICROSCOPIC
Bilirubin Urine: NEGATIVE
Glucose, UA: NEGATIVE mg/dL
Hgb urine dipstick: NEGATIVE
Ketones, ur: NEGATIVE mg/dL
Leukocytes,Ua: NEGATIVE
Nitrite: NEGATIVE
Protein, ur: NEGATIVE mg/dL
Specific Gravity, Urine: 1.015 (ref 1.005–1.030)
pH: 7 (ref 5.0–8.0)

## 2023-03-18 LAB — CBC
HCT: 41.4 % (ref 36.0–46.0)
Hemoglobin: 14.1 g/dL (ref 12.0–15.0)
MCH: 29.4 pg (ref 26.0–34.0)
MCHC: 34.1 g/dL (ref 30.0–36.0)
MCV: 86.3 fL (ref 80.0–100.0)
Platelets: 174 10*3/uL (ref 150–400)
RBC: 4.8 MIL/uL (ref 3.87–5.11)
RDW: 13.1 % (ref 11.5–15.5)
WBC: 6.1 10*3/uL (ref 4.0–10.5)
nRBC: 0 % (ref 0.0–0.2)

## 2023-03-18 LAB — PREGNANCY, URINE: Preg Test, Ur: NEGATIVE

## 2023-03-18 LAB — COMPREHENSIVE METABOLIC PANEL
ALT: 19 U/L (ref 0–44)
AST: 18 U/L (ref 15–41)
Albumin: 3.8 g/dL (ref 3.5–5.0)
Alkaline Phosphatase: 68 U/L (ref 38–126)
Anion gap: 10 (ref 5–15)
BUN: 7 mg/dL (ref 6–20)
CO2: 25 mmol/L (ref 22–32)
Calcium: 8.2 mg/dL — ABNORMAL LOW (ref 8.9–10.3)
Chloride: 96 mmol/L — ABNORMAL LOW (ref 98–111)
Creatinine, Ser: 0.64 mg/dL (ref 0.44–1.00)
GFR, Estimated: >60 mL/min (ref >60)
Glucose, Bld: 95 mg/dL (ref 70–99)
Potassium: 3.5 mmol/L (ref 3.5–5.1)
Sodium: 131 mmol/L — ABNORMAL LOW (ref 135–145)
Total Bilirubin: 0.5 mg/dL (ref 0.3–1.2)
Total Protein: 7.4 g/dL (ref 6.5–8.1)

## 2023-03-18 LAB — RESP PANEL BY RT-PCR (RSV, FLU A&B, COVID)  RVPGX2
Influenza A by PCR: NEGATIVE
Influenza B by PCR: NEGATIVE
Resp Syncytial Virus by PCR: NEGATIVE
SARS Coronavirus 2 by RT PCR: NEGATIVE

## 2023-03-18 LAB — LIPASE, BLOOD: Lipase: 32 U/L (ref 11–51)

## 2023-03-18 LAB — TROPONIN I (HIGH SENSITIVITY): Troponin I (High Sensitivity): <2 ng/L (ref <18)

## 2023-03-18 MED ORDER — ONDANSETRON 4 MG PO TBDP: ORAL_TABLET | ORAL | 0 refills | Status: DC

## 2023-03-18 MED ORDER — DOXYCYCLINE HYCLATE 100 MG PO CAPS: 100.0000 mg | ORAL_CAPSULE | Freq: Two times a day (BID) | ORAL | 0 refills | Status: AC

## 2023-03-18 MED ORDER — BENZONATATE 100 MG PO CAPS: 100.0000 mg | ORAL_CAPSULE | Freq: Three times a day (TID) | ORAL | 0 refills | Status: DC

## 2023-03-18 MED ORDER — ONDANSETRON 4 MG PO TBDP: ORAL_TABLET | ORAL | 0 refills | Status: AC

## 2023-03-18 MED ORDER — DOXYCYCLINE HYCLATE 100 MG PO CAPS: 100.0000 mg | ORAL_CAPSULE | Freq: Two times a day (BID) | ORAL | 0 refills | Status: DC

## 2023-03-18 MED ORDER — BENZONATATE 100 MG PO CAPS: 100.0000 mg | ORAL_CAPSULE | Freq: Three times a day (TID) | ORAL | 0 refills | Status: AC

## 2023-03-18 NOTE — ED Triage Notes (Signed)
Friday had chest pains. C/o fever, chills, cough, bodyaches since Saturday. Saturday night started having lower back pain & pain below rib cage. Hx of "gallbladder issues".  Last took tylenol at 0900

## 2023-03-18 NOTE — Discharge Instructions (Signed)
Take tylenol 2 pills 4 times a day and motrin 4 pills 3 times a day.  Drink plenty of fluids.  Return for worsening shortness of breath, headache, confusion. Follow up with your family doctor.   

## 2023-03-18 NOTE — ED Provider Notes (Signed)
[facility] Provider Note   CSN: [id number] Arrival date & time: 03/18/23  1719     History  Chief Complaint  Patient presents with   Fever    Paula Moran is a 32 y.o. female.  32 yo F with a chief complaints of fevers chest discomfort intermittent abdominal pain myalgias.  Has been going on for about 3 to 4 days.  She has a 44-month-old has had a bit of a runny nose but no obvious illness otherwise.  No other known sick contacts.  She is felt a little bit rundown and things got mildly worse.  Her mom has a history of having her gallbladder out so thought this might be the issue and had her sent here for evaluation.   Fever      Home Medications Prior to Admission medications   Medication Sig Start Date End Date Taking? Authorizing Provider  benzonatate (TESSALON) 100 MG capsule Take 1 capsule (100 mg total) by mouth every 8 (eight) hours. 03/18/23  Yes Deno Etienne, DO  doxycycline (VIBRAMYCIN) 100 MG capsule Take 1 capsule (100 mg total) by mouth 2 (two) times daily. One po bid x 7 days 03/18/23  Yes Deno Etienne, DO  ondansetron (ZOFRAN-ODT) 4 MG disintegrating tablet 4mg  ODT q4 hours prn nausea/vomit 03/18/23  Yes Deno Etienne, DO  amphetamine-dextroamphetamine (ADDERALL) 15 MG tablet Take 15 mg by mouth 2 (two) times daily.    [provider]  etonogestrel (NEXPLANON) 68 MG IMPL implant Inject 1 each into the skin once.    [provider]  famotidine (PEPCID) 20 MG tablet Take 1 tablet (20 mg total) by mouth 2 (two) times daily for 7 days. 04/24/21 05/01/21  Little, Wenda Overland, MD      Allergies    Tetanus toxoid    Review of Systems   Review of Systems  Constitutional:  Positive for fever.    Physical Exam Updated Vital Signs BP 130/85 (BP Location: Left Arm)   Pulse 99   Temp 99.7 F (37.6 C) (Oral)   Resp 20   Ht 5' (1.524 m)   Wt 67.1 kg   SpO2 99%   Breastfeeding Yes   BMI 28.90 kg/m  Physical  Exam Vitals and nursing note reviewed.  Constitutional:      General: She is not in acute distress.    Appearance: She is well-developed. She is not diaphoretic.  HENT:     Head: Normocephalic and atraumatic.     Comments: Swollen turbinates, posterior nasal drip, no noted sinus ttp, tm normal bilaterally.   Eyes:     Pupils: Pupils are equal, round, and reactive to light.  Cardiovascular:     Rate and Rhythm: Normal rate and regular rhythm.     Heart sounds: No murmur heard.    No friction rub. No gallop.  Pulmonary:     Effort: Pulmonary effort is normal.     Breath sounds: No wheezing or rales.  Abdominal:     General: There is no distension.     Palpations: Abdomen is soft.     Tenderness: There is no abdominal tenderness.     Comments: Benign abdominal exam  Musculoskeletal:        General: No tenderness.     Cervical back: Normal range of motion and neck supple.  Skin:    General: Skin is warm and dry.  Neurological:     Mental Status: She is alert and oriented  to person, place, and time.  Psychiatric:        Behavior: Behavior normal.     ED Results / Procedures / Treatments   Labs (all labs ordered are listed, but only abnormal results are displayed) Labs Reviewed  COMPREHENSIVE METABOLIC PANEL - Abnormal; Notable for the following components:      Result Value   Sodium 131 (*)    Chloride 96 (*)    Calcium 8.2 (*)    All other components within normal limits  RESP PANEL BY RT-PCR (RSV, FLU A&B, COVID)  [id number]  LIPASE, BLOOD  CBC  URINALYSIS, ROUTINE W REFLEX MICROSCOPIC  PREGNANCY, URINE  TROPONIN I (HIGH SENSITIVITY)    EKG None  Radiology DG Chest 2 View  Result Date: 03/18/2023 CLINICAL DATA:  Chest pain and fever EXAM: CHEST - 2 VIEW COMPARISON:  Chest x-ray 04/24/2021 FINDINGS: There is airspace disease in the right middle lobe. There is no pleural effusion or pneumothorax. Cardiomediastinal silhouette is within normal limits. Osseous  structures are within normal limits. IMPRESSION: Right middle lobe pneumonia. Follow-up chest x-ray recommended in 4-6 weeks to confirm resolution. Electronically Signed   By: Ronney Asters M.D.   On: 03/18/2023 18:25    Procedures Procedures    Medications Ordered in ED Medications - No data to display  ED Course/ Medical Decision Making/ A&P                             Medical Decision Making Amount and/or Complexity of Data Reviewed Labs: ordered. Radiology: ordered.  Risk Prescription drug management.   32 yo F with a chief complaints of congestion cough myalgias chest discomfort going on for about 3 to 4 days.  This most consistent with a viral syndrome but the patient does have focal infiltrate along the right medial heart border, on my independent interpretation radiology read is right middle lobe pneumonia.  Will start on oral antibiotics.  PCP follow-up.  LFTs and lipase are unremarkable and she has no abdominal discomfort on exam I feel this is less likely to be acute cholecystitis.  6:40 PM:  I have discussed the diagnosis/risks/treatment options with the patient and family.  Evaluation and diagnostic testing in the emergency department does not suggest an emergent condition requiring admission or immediate intervention beyond what has been performed at this time.  They will follow up with PCP. We also discussed returning to the ED immediately if new or worsening sx occur. We discussed the sx which are most concerning (e.g., sudden worsening pain, fever, inability to tolerate by mouth) that necessitate immediate return. Medications administered to the patient during their visit and any new prescriptions provided to the patient are listed below.  Medications given during this visit Medications - No data to display   The patient appears reasonably screen and/or stabilized for discharge and I doubt any other medical condition or other [facility] requiring further screening,  evaluation, or treatment in the ED at this time prior to discharge.          Final Clinical Impression(s) / ED Diagnoses Final diagnoses:  Pneumonia of right middle lobe due to infectious organism    Rx / DC Orders ED Discharge Orders          Ordered    doxycycline (VIBRAMYCIN) 100 MG capsule  2 times daily        03/18/23 1838    benzonatate (TESSALON) 100 MG capsule  Every 8  hours        03/18/23 1838    ondansetron (ZOFRAN-ODT) 4 MG disintegrating tablet        03/18/23 1838              Deno Etienne, DO 03/18/23 1840

## 2023-03-18 NOTE — ED Notes (Signed)
Reviewing discharge papers with pt. Pt questioning use of doxycycline due to breastfeeding. EDP notified
